# Patient Record
Sex: Female | Born: 1990 | Race: Black or African American | Hispanic: No | Marital: Single | Smoking: Current every day smoker
Health system: Southern US, Community
[De-identification: ages and names within clinical notes are randomized; demographics above are authoritative.]

## PROBLEM LIST (undated history)

## (undated) HISTORY — PX: BREAST SURGERY: SHX581

## (undated) HISTORY — PX: TONSILLECTOMY: SUR1361

---
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
## 2010-02-11 ENCOUNTER — Emergency Department: Payer: Self-pay | Admitting: Emergency Medicine

## 2010-03-15 ENCOUNTER — Emergency Department: Payer: Self-pay | Admitting: Emergency Medicine

## 2010-07-08 ENCOUNTER — Inpatient Hospital Stay: Payer: Self-pay

## 2011-01-09 ENCOUNTER — Emergency Department: Payer: Self-pay | Admitting: Emergency Medicine

## 2011-01-15 ENCOUNTER — Observation Stay: Payer: Self-pay | Admitting: Surgery

## 2011-04-20 ENCOUNTER — Emergency Department: Payer: Self-pay | Admitting: Emergency Medicine

## 2011-04-25 ENCOUNTER — Emergency Department: Payer: Self-pay | Admitting: Unknown Physician Specialty

## 2011-06-18 ENCOUNTER — Emergency Department: Payer: Self-pay | Admitting: *Deleted

## 2011-06-19 ENCOUNTER — Emergency Department: Payer: Self-pay | Admitting: *Deleted

## 2011-08-02 ENCOUNTER — Emergency Department: Payer: Self-pay | Admitting: Emergency Medicine

## 2012-05-12 ENCOUNTER — Emergency Department: Payer: Self-pay | Admitting: Emergency Medicine

## 2012-05-17 ENCOUNTER — Inpatient Hospital Stay: Payer: Self-pay | Admitting: Surgery

## 2012-05-17 LAB — CBC WITH DIFFERENTIAL/PLATELET
Basophil #: 0.1 10*3/uL (ref 0.0–0.1)
Basophil %: 0.9 %
Eosinophil %: 1 %
HGB: 13 g/dL (ref 12.0–16.0)
Lymphocyte %: 26.8 %
MCHC: 32.2 g/dL (ref 32.0–36.0)
Neutrophil #: 5.4 10*3/uL (ref 1.4–6.5)
Neutrophil %: 63.7 %
Platelet: 236 10*3/uL (ref 150–440)
RBC: 4.17 10*6/uL (ref 3.80–5.20)
WBC: 8.5 10*3/uL (ref 3.6–11.0)

## 2012-05-17 LAB — BASIC METABOLIC PANEL
Calcium, Total: 9.1 mg/dL (ref 8.5–10.1)
Chloride: 105 mmol/L (ref 98–107)
Co2: 27 mmol/L (ref 21–32)
Creatinine: 0.8 mg/dL (ref 0.60–1.30)
Potassium: 3.7 mmol/L (ref 3.5–5.1)
Sodium: 139 mmol/L (ref 136–145)

## 2012-05-25 LAB — WOUND CULTURE

## 2012-07-31 ENCOUNTER — Emergency Department: Payer: Self-pay | Admitting: Emergency Medicine

## 2012-09-17 ENCOUNTER — Emergency Department: Payer: Self-pay | Admitting: Emergency Medicine

## 2012-09-19 LAB — BETA STREP CULTURE(ARMC)

## 2012-12-10 ENCOUNTER — Emergency Department: Payer: Self-pay | Admitting: Emergency Medicine

## 2013-03-13 ENCOUNTER — Emergency Department: Payer: Self-pay | Admitting: Emergency Medicine

## 2013-04-23 ENCOUNTER — Emergency Department: Payer: Self-pay | Admitting: Emergency Medicine

## 2014-04-30 ENCOUNTER — Emergency Department: Payer: Self-pay | Admitting: Emergency Medicine

## 2014-12-30 NOTE — H&P (Signed)
PATIENT NAME:  Emma Gonzalez, Emma Gonzalez MR#:  [id number] DATE OF BIRTH:  05-27-1991  DATE OF ADMISSION:  05/17/2012  CHIEF COMPLAINT: Left breast pain.   HISTORY OF PRESENT ILLNESS: This is a patient with a prior history of a left breast abscess incised and drained in May 2012 by Dr. Michela PitcherEly. She was seen by me at the same time.   Patient describes increasing left breast pain starting two weeks ago. She was in the ED a week ago started on antibiotics and this has gradually worsened. She denies fevers or chills. No nausea or vomiting. Her pain is worsening and it is limited to the medial side of the left breast.  She has had an episode like this before necessitating surgery as above.   She denies nipple discharge.   PAST MEDICAL HISTORY: Left breast abscess.   PAST SURGICAL HISTORY:  1. Left breast abscess incision and drainage.   2. Tonsillectomy.  3. Adenoidectomy.   PAST OB HISTORY: G1 P1, AB 0.   FAMILY HISTORY: No history of breast cancer in the family.   SOCIAL HISTORY: Patient smokes tobacco. Does not drink alcohol. Works in a [employer].   REVIEW OF SYSTEMS: 10 system review was performed and negative with the exception of that mentioned in the history of present illness.   PHYSICAL EXAMINATION:  GENERAL: Tearful, anxious-appearing black female patient.   VITALS: Temperature 98.3, pulse 95, respirations 20, blood pressure 117/63, pain scale of 10, 97% room air sats, BMI 29.    HEENT: No scleral icterus.   NECK: No palpable neck nodes.   CHEST: Clear to auscultation.   CARDIAC: Regular rate and rhythm.   ABDOMEN: Soft, nontender.   EXTREMITIES: Without edema. Calves are nontender.   NEUROLOGIC: Grossly intact.   INTEGUMENT: No jaundice. Multiple tattoos are present.   BREASTS: Examination shows left breast with a medially placed mass with erythema and fluctuance beneath a scar at the periareolar area. She also has upper outer quadrant and a lower outer quadrant scar  which is well healed.   LABORATORY, DIAGNOSTIC, AND RADIOLOGICAL DATA: Laboratory values are not available. No imaging has been performed.   ASSESSMENT AND PLAN: This is a patient with left breast abscess. She had this happen to her in a year ago and that required formal incision and drainage with Penrose drains in the Operating Room back in May 2012. This was performed by Dr. Michela PitcherEly and I had seen the patient as well at that time.   I discussed with her the rationale for offering surgery again. She was in full agreement with that plan. Unfortunately, the patient ate lunch at 13:00 to 13:30 hours, tacos, and will need to be Gonzalez.p.o. for several hours prior to scheduling this procedure. I have discussed this with the nursing supervisor for scheduling later on tonight.    Discussed with the patient the rationale for this operation and the risks of bleeding, infection, recurrent abscesses, drain placement, etc. and also the need for doing this later on tonight and that it would be done by Dr. Michela PitcherEly. She understood and agreed to proceed.   ____________________________ Adah Salvageichard E. Excell Seltzerooper, MD rec:cms D: 05/17/2012 17:56:46 ET T: 05/18/2012 06:06:55 ET JOB#: [id number]  cc: Adah Salvageichard E. Excell Seltzerooper, MD, <Dictator>  Lattie HawICHARD E COOPER MD ELECTRONICALLY SIGNED 05/18/2012 8:12

## 2014-12-30 NOTE — Op Note (Signed)
PATIENT NAME:  Emma SimonDEARMON, [name] MR#:  [id number] DATE OF BIRTH:  1991/07/17  DATE OF PROCEDURE:  05/17/2012  PREOPERATIVE DIAGNOSIS: Left breast abscess.   POSTOPERATIVE DIAGNOSIS:  Left breast abscess.  PROCEDURE: Mastotomy with incision and drainage right breast.   SURGEON: Quentin Orealph L. Ely, MD   ANESTHESIA: General.   OPERATIVE PROCEDURE: With the patient in the supine position after induction of appropriate general anesthesia, the patient's left breast was prepped with Betadine and draped with sterile towels. The site of her previous drainage was re-incised and a large amount of creamy purulence removed. The abscess cavity was approximately the size of a golf ball. The purulence was cultured. The area was irrigated. The abscess cavity appeared to track to the left upper outer quadrant. The previous counter incision was reopened and a Penrose drain was placed for counter drainage. The drain was secured with 3-0 nylon. Sterile dressing was applied. The patient was returned to the recovery room having tolerated the procedure well. Sponge, instrument, and needle counts were correct times two in the operating room.   ____________________________ Quentin Orealph L. Ely III, MD rle:bjt D: 05/17/2012 21:33:06 ET T: 05/18/2012 10:05:10 ET JOB#: [id number]  cc: Quentin Orealph L. Ely III, MD, <Dictator> Quentin OreALPH L ELY MD ELECTRONICALLY SIGNED 05/21/2012 8:34

## 2014-12-30 NOTE — H&P (Signed)
    Subjective/Chief Complaint left breast pain    History of Present Illness left br pain 2 weeks, in ED last week. on abx but worsening. no f/c. Had prev episode one year ago with I&D left br abscess. ate lunch 1-1:30.    Past History Left br abscess 2012 G1P1Ab0 no FH of br cancer PMH none PSH T&A, lft br abscess   Past Med/Surgical Hx:  Denies medical history:   Denies medical history:   Tonsillectomy and Adenoidectomy:   ALLERGIES:  No Known Allergies:   Family and Social History:   Family History Non-Contributory    Social History positive  tobacco, negative ETOH, warehouse    + Tobacco Current (within 1 year)    Place of Living Home   Review of Systems:   Subjective/Chief Complaint left breast pain    Fever/Chills No    Cough No    Abdominal Pain No    Diarrhea No    Nausea/Vomiting No    SOB/DOE No    Chest Pain No    Dysuria No    Tolerating Diet Yes  ate at 1300   Physical Exam:   GEN uncomfortable, tearful    HEENT pink conjunctivae    NECK supple    RESP normal resp effort  clear BS  no use of accessory muscles    CARD regular rate    ABD denies tenderness  soft    LYMPH negative neck, negative axillae    EXTR negative edema    SKIN normal to palpation, multiple tattoos    PSYCH alert, A+O to time, place, person, good insight, anxious    Additional Comments left breast scars, medial tender erythematous mass with fluctuance     Assessment/Admission Diagnosis labs pending left br abscess rec I&D risks and options in detail see dictation   Electronic Signatures: Lattie Hawooper, Yamilet Mcfayden E (MD)  (Signed 05-Sep-13 17:52)  Authored: CHIEF COMPLAINT and HISTORY, PAST MEDICAL/SURGIAL HISTORY, ALLERGIES, FAMILY AND SOCIAL HISTORY, REVIEW OF SYSTEMS, PHYSICAL EXAM, ASSESSMENT AND PLAN   Last Updated: 05-Sep-13 17:52 by Lattie Hawooper, Aviyah Swetz E (MD)

## 2015-11-11 ENCOUNTER — Emergency Department
Admission: EM | Admit: 2015-11-11 | Discharge: 2015-11-11 | Disposition: A | Discharge status: Home / Self Care | Payer: Self-pay | Attending: Emergency Medicine | Admitting: Emergency Medicine

## 2015-11-11 ENCOUNTER — Encounter: Payer: Self-pay | Admitting: Emergency Medicine

## 2015-11-11 DIAGNOSIS — A5901 Trichomonal vulvovaginitis: Secondary | ICD-10-CM | POA: Insufficient documentation

## 2015-11-11 DIAGNOSIS — A599 Trichomoniasis, unspecified: Secondary | ICD-10-CM

## 2015-11-11 DIAGNOSIS — F17200 Nicotine dependence, unspecified, uncomplicated: Secondary | ICD-10-CM | POA: Insufficient documentation

## 2015-11-11 DIAGNOSIS — N730 Acute parametritis and pelvic cellulitis: Secondary | ICD-10-CM | POA: Insufficient documentation

## 2015-11-11 DIAGNOSIS — Z3202 Encounter for pregnancy test, result negative: Secondary | ICD-10-CM | POA: Insufficient documentation

## 2015-11-11 DIAGNOSIS — N390 Urinary tract infection, site not specified: Secondary | ICD-10-CM | POA: Insufficient documentation

## 2015-11-11 LAB — BASIC METABOLIC PANEL
Anion gap: 5 (ref 5–15)
BUN: 7 mg/dL (ref 6–20)
CALCIUM: 8.8 mg/dL — AB (ref 8.9–10.3)
CHLORIDE: 108 mmol/L (ref 101–111)
CO2: 26 mmol/L (ref 22–32)
CREATININE: 0.78 mg/dL (ref 0.44–1.00)
GFR calc non Af Amer: >60 mL/min (ref >60)
GLUCOSE: 85 mg/dL (ref 65–99)
Potassium: 3.8 mmol/L (ref 3.5–5.1)
Sodium: 139 mmol/L (ref 135–145)

## 2015-11-11 LAB — URINALYSIS COMPLETE WITH MICROSCOPIC (ARMC ONLY)
BILIRUBIN URINE: NEGATIVE
GLUCOSE, UA: NEGATIVE mg/dL
KETONES UR: NEGATIVE mg/dL
Nitrite: NEGATIVE
Protein, ur: NEGATIVE mg/dL
Specific Gravity, Urine: 1.008 (ref 1.005–1.030)
pH: 7 (ref 5.0–8.0)

## 2015-11-11 LAB — CBC WITH DIFFERENTIAL/PLATELET
BASOS PCT: 1 %
Basophils Absolute: 0 10*3/uL (ref 0–0.1)
Eosinophils Absolute: 0.1 10*3/uL (ref 0–0.7)
Eosinophils Relative: 1 %
HEMATOCRIT: 41.6 % (ref 35.0–47.0)
HEMOGLOBIN: 14 g/dL (ref 12.0–16.0)
LYMPHS ABS: 1.2 10*3/uL (ref 1.0–3.6)
LYMPHS PCT: 20 %
MCH: 32.6 pg (ref 26.0–34.0)
MCHC: 33.5 g/dL (ref 32.0–36.0)
MCV: 97.3 fL (ref 80.0–100.0)
MONO ABS: 0.5 10*3/uL (ref 0.2–0.9)
MONOS PCT: 9 %
NEUTROS ABS: 4.3 10*3/uL (ref 1.4–6.5)
NEUTROS PCT: 69 %
Platelets: 233 10*3/uL (ref 150–440)
RBC: 4.28 MIL/uL (ref 3.80–5.20)
RDW: 14.2 % (ref 11.5–14.5)
WBC: 6.2 10*3/uL (ref 3.6–11.0)

## 2015-11-11 LAB — WET PREP, GENITAL
Sperm: NONE SEEN
Yeast Wet Prep HPF POC: NONE SEEN

## 2015-11-11 LAB — CHLAMYDIA/NGC RT PCR (ARMC ONLY)
CHLAMYDIA TR: DETECTED — AB
N GONORRHOEAE: NOT DETECTED

## 2015-11-11 LAB — POCT PREGNANCY, URINE: PREG TEST UR: NEGATIVE

## 2015-11-11 MED ORDER — CEPHALEXIN 500 MG PO CAPS: 500.0000 mg | ORAL_CAPSULE | Freq: Four times a day (QID) | ORAL | Status: AC

## 2015-11-11 MED ORDER — AZITHROMYCIN 500 MG PO TABS
1000.0000 mg | ORAL_TABLET | Freq: Once | ORAL | Status: AC
  Administered 2015-11-11: 1000 mg via ORAL
  Filled 2015-11-11: qty 2

## 2015-11-11 MED ORDER — ONDANSETRON HCL 4 MG PO TABS: 4.0000 mg | ORAL_TABLET | Freq: Every day | ORAL | Status: DC | PRN

## 2015-11-11 MED ORDER — AZITHROMYCIN 500 MG PO TABS: 1000.0000 mg | ORAL_TABLET | Freq: Once | ORAL | Status: DC

## 2015-11-11 MED ORDER — SODIUM CHLORIDE 0.9 % IV BOLUS (SEPSIS): 30.0000 mL/kg | Freq: Once | INTRAVENOUS | Status: DC

## 2015-11-11 MED ORDER — METRONIDAZOLE 500 MG PO TABS: 500.0000 mg | ORAL_TABLET | Freq: Three times a day (TID) | ORAL | Status: DC

## 2015-11-11 MED ORDER — MORPHINE SULFATE (PF) 4 MG/ML IV SOLN
4.0000 mg | Freq: Once | INTRAVENOUS | Status: AC
  Administered 2015-11-11: 4 mg via INTRAVENOUS
  Filled 2015-11-11: qty 1

## 2015-11-11 MED ORDER — SODIUM CHLORIDE 0.9 % IV BOLUS (SEPSIS)
1000.0000 mL | Freq: Once | INTRAVENOUS | Status: AC
  Administered 2015-11-11: 1000 mL via INTRAVENOUS

## 2015-11-11 MED ORDER — DEXTROSE 5 % IV SOLN
1.0000 g | Freq: Once | INTRAVENOUS | Status: AC
  Administered 2015-11-11: 1 g via INTRAVENOUS
  Filled 2015-11-11: qty 10

## 2015-11-11 MED ORDER — MORPHINE SULFATE (PF) 4 MG/ML IV SOLN: 4.0000 mg | Freq: Once | INTRAVENOUS | Status: DC

## 2015-11-11 MED ORDER — SODIUM CHLORIDE 0.9 % IV BOLUS (SEPSIS): 1000.0000 mL | Freq: Once | INTRAVENOUS | Status: DC

## 2015-11-11 MED ORDER — OXYCODONE-ACETAMINOPHEN 5-325 MG PO TABS: 1.0000 | ORAL_TABLET | Freq: Four times a day (QID) | ORAL | Status: DC | PRN

## 2015-11-11 MED ORDER — METRONIDAZOLE IN NACL 5-0.79 MG/ML-% IV SOLN
500.0000 mg | Freq: Once | INTRAVENOUS | Status: AC
Start: 2015-11-11 — End: 2015-11-11
  Administered 2015-11-11: 500 mg via INTRAVENOUS
  Filled 2015-11-11: qty 100

## 2015-11-11 MED ORDER — CEPHALEXIN 500 MG PO CAPS: 500.0000 mg | ORAL_CAPSULE | Freq: Four times a day (QID) | ORAL | Status: DC

## 2015-11-11 MED ORDER — DOXYCYCLINE HYCLATE 100 MG PO CAPS: 100.0000 mg | ORAL_CAPSULE | Freq: Two times a day (BID) | ORAL | Status: DC

## 2015-11-11 NOTE — ED Notes (Signed)
Pt in with complaints of a "lump on vaginal wall."  Pt reports discomfort with intercourse beginning approximately 1 week ago.  Pt reports having a normal menstrual cycle ending yesterday; denies any bleeding since then.  Pt A/Ox4, pt tearful, no immediate distress at this time.

## 2015-11-11 NOTE — Discharge Instructions (Signed)

## 2015-11-11 NOTE — ED Provider Notes (Addendum)
[facility] Emergency Department Provider Note  ____________________________________________   I have reviewed the triage vital signs and the nursing notes.   HISTORY  Chief Complaint Abscess    HPI Emma Gonzalez is a 25 y.o. female who is not pregnant, states she is is sexually active. She states that her partner, with him she no longer is told he had chlamydia and that she should be tested. This is about a week ago. Patient then had a gradual onset vaginal discharge and she thought she felt a bump in her vagina. She has had no fever no chills no nausea no vomiting. She has had a purulent vaginal discharge. Her last missed her period ended yesterday. She denies bleeding since that time. She felt that the bump was on the left side of her vagina. She states it may have burst already. She has never had a Bartholin's cyst.   History reviewed. No pertinent past medical history.  There are no active problems to display for this patient.   History reviewed. No pertinent past surgical history.  No current outpatient prescriptions on file.  Allergies Review of patient's allergies indicates no known allergies.  History reviewed. No pertinent family history.  Social History Social History  Substance Use Topics  . Smoking status: Current Every Day Smoker  . Smokeless tobacco: None  . Alcohol Use: None    Review of Systems Constitutional: No fever/chills Eyes: No visual changes. ENT: No sore throat. No stiff neck no neck pain Cardiovascular: Denies chest pain. Respiratory: Denies shortness of breath. Gastrointestinal:   no vomiting.  No diarrhea.  No constipation. Genitourinary: Negative for dysuria. Musculoskeletal: Negative lower extremity swelling Skin: Negative for rash. Neurological: Negative for headaches, focal weakness or numbness. 10-point ROS otherwise negative.  ____________________________________________   PHYSICAL  EXAM:  VITAL SIGNS: ED Triage Vitals  Enc Vitals Group     BP 11/11/15 0906 115/74 mmHg     Pulse Rate 11/11/15 0906 90     Resp 11/11/15 0906 18     Temp 11/11/15 0906 98.6 F (37 C)     Temp Source 11/11/15 0906 Oral     SpO2 11/11/15 0906 99 %     Weight 11/11/15 0906 185 lb (83.915 kg)     Height 11/11/15 0906 5\' 6"  (1.676 m)     Head Cir --      Peak Flow --      Pain Score 11/11/15 0858 10     Pain Loc --      Pain Edu? --      Excl. in GC? --     Constitutional: Alert and oriented. Well appearing and in no acute distress. Eyes: Conjunctivae are normal. PERRL. EOMI. Head: Atraumatic. Nose: No congestion/rhinnorhea. Mouth/Throat: Mucous membranes are moist.  Oropharynx non-erythematous. Neck: No stridor.   Nontender with no meningismus Cardiovascular: Normal rate, regular rhythm. Grossly normal heart sounds.  Good peripheral circulation. Respiratory: Normal respiratory effort.  No retractions. Lungs CTAB. Abdominal: Soft and nontender. No distention. No guarding no rebound Back:  There is no focal tenderness or step off there is no midline tenderness there are no lesions noted. there is no CVA tenderness Pelvic exam: Female nurse chaperone present, no external lesions noted, I do not palpate any mass noted I see any cyst or mass. Purulent yellow vaginal discharge noted with no purulent discharge, positive cervical motion tenderness, no adnexal tenderness or mass, there is no significant uterine tenderness or mass. No vaginal bleeding Musculoskeletal:  No lower extremity tenderness. No joint effusions, no DVT signs strong distal pulses no edema Neurologic:  Normal speech and language. No gross focal neurologic deficits are appreciated.  Skin:  Skin is warm, dry and intact. No rash noted. Psychiatric: Mood and affect are normal. Speech and behavior are normal.  ____________________________________________   LABS (all labs ordered are listed, but only abnormal results are  displayed)  Labs Reviewed  WET PREP, GENITAL - Abnormal; Notable for the following:    Trich, Wet Prep PRESENT (*)    Clue Cells Wet Prep HPF POC PRESENT (*)    WBC, Wet Prep HPF POC MANY (*)    All other components within normal limits  URINALYSIS COMPLETEWITH MICROSCOPIC ([facility] ONLY) - Abnormal; Notable for the following:    Color, Urine YELLOW (*)    APPearance HAZY (*)    Hgb urine dipstick 3+ (*)    Leukocytes, UA 3+ (*)    Bacteria, UA RARE (*)    Squamous Epithelial / LPF 6-30 (*)    All other components within normal limits  CHLAMYDIA/NGC RT PCR ([facility] ONLY)  CBC WITH DIFFERENTIAL/PLATELET  RPR  HIV ANTIBODY (ROUTINE TESTING)  BASIC METABOLIC PANEL  POC URINE PREG, ED  POCT PREGNANCY, URINE   ____________________________________________  EKG  I personally interpreted any EKGs ordered by me or triage  ____________________________________________  RADIOLOGY  I reviewed any imaging ordered by me or triage that were performed during my shift ____________________________________________   PROCEDURES  Procedure(s) performed: None  Critical Care performed: None  ____________________________________________   INITIAL IMPRESSION / ASSESSMENT AND PLAN / ED COURSE  Pertinent labs & imaging results that were available during my care of the patient were reviewed by me and considered in my medical decision making (see chart for details).  Patient with cervical motion tenderness and purulent vaginal discharge with known Chlamydia exposure and positive Trichomonas. I'm giving her Rocephin here as well as azithromycin, we are giving her Flagyl. We will treat her at home with Flagyl and doxycycline. Evidence of PID is certainly present there is nothing at this time to suggest the patient has a tubo-ovarian abscess, or any other acute abdominal pathology. Her adnexal exam is normal. No other sign or see any evidence of a cyst, Bartholin's are otherwise noted or see  an abscess at this time. Certainly possible that she had one that ruptured but this time is no evidence of it. Extensive return precautions and follow-up given and understood. Heart rate was up at one point in the 120s however that was during the pelvic exam. Since that time she has been comfortable and calm. I do not believe she is septic. White count is reassuring.Patient is very comfortable with the discharge plan. Customary extensive return precautions and follow-up explained to and understood by the patient.  Questions answered.  ----------------------------------------- 2:23 PM on 11/11/2015 -----------------------------------------  Dr. Tiburcio Pea agrees with plan and management and will follow up for outpatient evaluation. The patient this time has no complaints. Her urine certainly looks like it could be infected but I suspect this is mostly from her significant vaginal discharge. She prefers not to have a urine catheter at this time. We'll send a urine culture and I will appear to treat her but she has no symptoms or UTI at the bases from a poor clean catch. I have a splint to the patient multiple times that a urine catheter would give me a better information about possible UTI that this time she declines. She states  she is tender down there does not wish a catheterization. Of course this is her choice. I will therefore add Keflex under the management of her possible UTI.  ____________________________________________   FINAL CLINICAL IMPRESSION(S) / ED DIAGNOSES  Final diagnoses:  None      This chart was dictated using voice recognition software.  Despite best efforts to proofread,  errors can occur which can change meaning.     Jeanmarie Plant, MD 11/11/15 1414  Jeanmarie Plant, MD 11/11/15 1424  Jeanmarie Plant, MD 11/11/15 1427  Jeanmarie Plant, MD 11/11/15 [phone]

## 2015-11-11 NOTE — ED Notes (Signed)
Pt called out, reports "I think it just bust."  Clear, white discharge noted from vagina at this time.  No bleeding noted.

## 2015-11-11 NOTE — ED Notes (Signed)
Reports abscess in vaginal area

## 2015-11-12 LAB — RPR: RPR Ser Ql: NONREACTIVE

## 2015-11-12 LAB — HIV ANTIBODY (ROUTINE TESTING W REFLEX): HIV Screen 4th Generation wRfx: NONREACTIVE

## 2015-11-13 ENCOUNTER — Telehealth: Payer: Self-pay | Admitting: Emergency Medicine

## 2015-11-13 LAB — URINE CULTURE

## 2015-11-13 NOTE — ED Notes (Signed)
Called pt to inform of positive chlamydia test.  She was treated in the ED.  Left message asking her to call me.

## 2015-12-30 ENCOUNTER — Encounter: Payer: Self-pay | Admitting: Emergency Medicine

## 2015-12-30 ENCOUNTER — Emergency Department
Admission: EM | Admit: 2015-12-30 | Discharge: 2015-12-30 | Disposition: A | Discharge status: Home / Self Care | Attending: Student | Admitting: Student

## 2015-12-30 ENCOUNTER — Emergency Department

## 2015-12-30 DIAGNOSIS — Y9389 Activity, other specified: Secondary | ICD-10-CM | POA: Insufficient documentation

## 2015-12-30 DIAGNOSIS — W228XXA Striking against or struck by other objects, initial encounter: Secondary | ICD-10-CM | POA: Insufficient documentation

## 2015-12-30 DIAGNOSIS — Y929 Unspecified place or not applicable: Secondary | ICD-10-CM | POA: Insufficient documentation

## 2015-12-30 DIAGNOSIS — Y999 Unspecified external cause status: Secondary | ICD-10-CM | POA: Insufficient documentation

## 2015-12-30 DIAGNOSIS — F17210 Nicotine dependence, cigarettes, uncomplicated: Secondary | ICD-10-CM | POA: Insufficient documentation

## 2015-12-30 DIAGNOSIS — S0093XA Contusion of unspecified part of head, initial encounter: Secondary | ICD-10-CM

## 2015-12-30 MED ORDER — ACETAMINOPHEN 500 MG PO TABS
1000.0000 mg | ORAL_TABLET | Freq: Once | ORAL | Status: AC
  Administered 2015-12-30: 1000 mg via ORAL
  Filled 2015-12-30: qty 2

## 2015-12-30 MED ORDER — ONDANSETRON 4 MG PO TBDP
4.0000 mg | ORAL_TABLET | Freq: Once | ORAL | Status: AC
  Administered 2015-12-30: 4 mg via ORAL

## 2015-12-30 MED ORDER — IBUPROFEN 800 MG PO TABS
800.0000 mg | ORAL_TABLET | Freq: Once | ORAL | Status: AC
  Administered 2015-12-30: 800 mg via ORAL
  Filled 2015-12-30: qty 1

## 2015-12-30 MED ORDER — ONDANSETRON 8 MG PO TBDP
ORAL_TABLET | ORAL | Status: AC
  Filled 2015-12-30: qty 1
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
## 2015-12-30 NOTE — Discharge Instructions (Signed)
Facial or Scalp Contusion °A facial or scalp contusion is a deep bruise on the face or head. Injuries to the face and head generally cause a lot of swelling, especially around the eyes. Contusions are the result of an injury that caused bleeding under the skin. The contusion may turn blue, purple, or yellow. Minor injuries will give you a painless contusion, but more severe contusions may stay painful and swollen for a few weeks.  °CAUSES  °A facial or scalp contusion is caused by a blunt injury or trauma to the face or head area.  °SIGNS AND SYMPTOMS  °· Swelling of the injured area.   °· Discoloration of the injured area.   °· Tenderness, soreness, or pain in the injured area.   °DIAGNOSIS  °The diagnosis can be made by taking a medical history and doing a physical exam. An X-ray exam, CT scan, or MRI may be needed to determine if there are any associated injuries, such as broken bones (fractures). °TREATMENT  °Often, the best treatment for a facial or scalp contusion is applying cold compresses to the injured area. Over-the-counter medicines may also be recommended for pain control.  °HOME CARE INSTRUCTIONS  °· Only take over-the-counter or prescription medicines as directed by your health care provider.   °· Apply ice to the injured area.   °· Put ice in a plastic bag.   °· Place a towel between your skin and the bag.   °· Leave the ice on for 20 minutes, 2-3 times a day.   °SEEK MEDICAL CARE IF: °· You have bite problems.   °· You have pain with chewing.   °· You are concerned about facial defects. °SEEK IMMEDIATE MEDICAL CARE IF: °· You have severe pain or a headache that is not relieved by medicine.   °· You have unusual sleepiness, confusion, or personality changes.   °· You throw up (vomit).   °· You have a persistent nosebleed.   °· You have double vision or blurred vision.   °· You have fluid drainage from your nose or ear.   °· You have difficulty walking or using your arms or legs.   °MAKE SURE YOU:   °· Understand these instructions. °· Will watch your condition. °· Will get help right away if you are not doing well or get worse. °  °This information is not intended to replace advice given to you by your health care provider. Make sure you discuss any questions you have with your health care provider. °  °Document Released: 10/06/2004 Document Revised: 09/19/2014 Document Reviewed: 04/11/2013 °Elsevier Interactive Patient Education ©2016 [employer] ° °Head Injury, Adult °You have a head injury. Headaches and throwing up (vomiting) are common after a head injury. It should be easy to wake up from sleeping. Sometimes you must stay in the hospital. Most problems happen within the first 24 hours. Side effects may occur up to 7-10 days after the injury.  °WHAT ARE THE TYPES OF HEAD INJURIES? °Head injuries can be as minor as a bump. Some head injuries can be more severe. More severe head injuries include: °· A jarring injury to the brain (concussion). °· A bruise of the brain (contusion). This mean there is bleeding in the brain that can cause swelling. °· A cracked skull (skull fracture). °· Bleeding in the brain that collects, clots, and forms a bump (hematoma). °WHEN SHOULD I GET HELP RIGHT AWAY?  °· You are confused or sleepy. °· You cannot be woken up. °· You feel sick to your stomach (nauseous) or keep throwing up (vomiting). °· Your dizziness or   unsteadiness is getting worse. °· You have very bad, lasting headaches that are not helped by medicine. Take medicines only as told by your doctor. °· You cannot use your arms or legs like normal. °· You cannot walk. °· You notice changes in the black spots in the center of the colored part of your eye (pupil). °· You have clear or bloody fluid coming from your nose or ears. °· You have trouble seeing. °During the next 24 hours after the injury, you must stay with someone who can watch you. This person should get help right away (call 911 in the [place] if you start  to shake and are not able to control it (have seizures), you pass out, or you are unable to wake up. °HOW CAN I PREVENT A HEAD INJURY IN THE FUTURE? °· Wear seat belts. °· Wear a helmet while bike riding and playing sports like football. °· Stay away from dangerous activities around the house. °WHEN CAN I RETURN TO NORMAL ACTIVITIES AND ATHLETICS? °See your doctor before doing these activities. You should not do normal activities or play contact sports until 1 week after the following symptoms have stopped: °· Headache that does not go away. °· Dizziness. °· Poor attention. °· Confusion. °· Memory problems. °· Sickness to your stomach or throwing up. °· Tiredness. °· Fussiness. °· Bothered by bright lights or loud noises. °· Anxiousness or depression. °· Restless sleep. °MAKE SURE YOU:  °· Understand these instructions. °· Will watch your condition. °· Will get help right away if you are not doing well or get worse. °  °This information is not intended to replace advice given to you by your health care provider. Make sure you discuss any questions you have with your health care provider. °  °Document Released: 08/11/2008 Document Revised: 09/19/2014 Document Reviewed: 05/06/2013 °Elsevier Interactive Patient Education ©2016 Elsevier Inc. ° °

## 2015-12-30 NOTE — ED Notes (Signed)
Patient is tearful and states she has a headache. States she does not really know why she is here. Per officer patient was arrested this am and bit officer. He presents Therapist, occupationalmagistrate order for blood draw due to officer exposure.

## 2015-12-30 NOTE — ED Notes (Signed)
Patient c/o HA. Patient states, "My head hurts because its been slapped into the concrete". Patient has small brusied area to forehead.

## 2017-09-11 IMAGING — CT CT HEAD W/O CM
1 series · 16 of 30 positions shown, 20 images · non-contrast
Comparison: 04/24/2013

CLINICAL DATA: Headaches following possible head trauma

EXAM:
CT HEAD WITHOUT CONTRAST
TECHNIQUE: Contiguous axial images were obtained from the base of the skull
through the vertex without intravenous contrast.

[Series 2: head wo · axial · 0.39mm/px · z∈[-157,-31]mm · 16 of 32 slices shown, 20 images]
[im 2/32  brain]
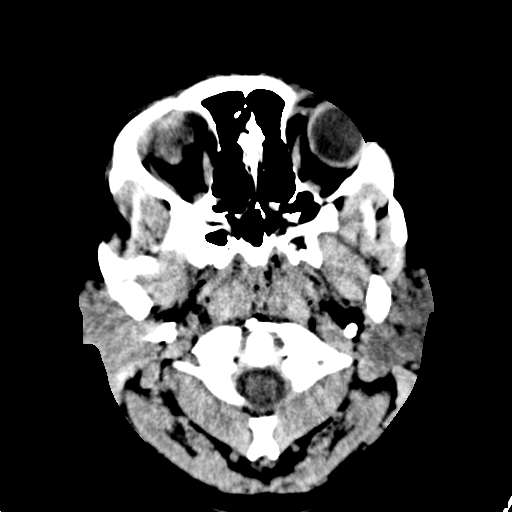
[im 2/32  bone]
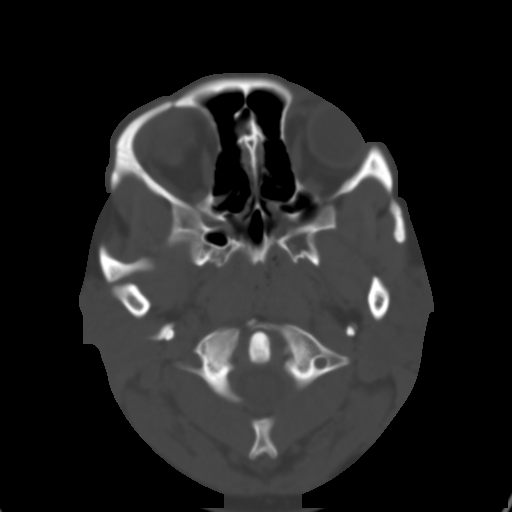
[im 4/32  brain]
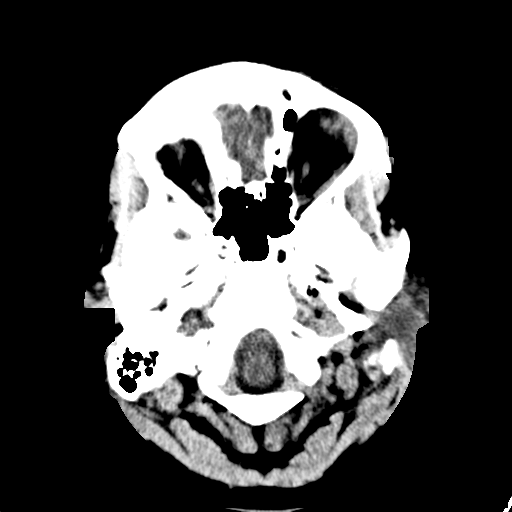
[im 6/32  brain]
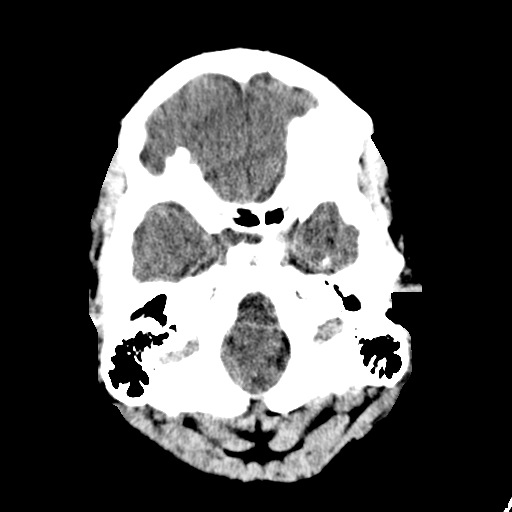
[im 8/32  brain]
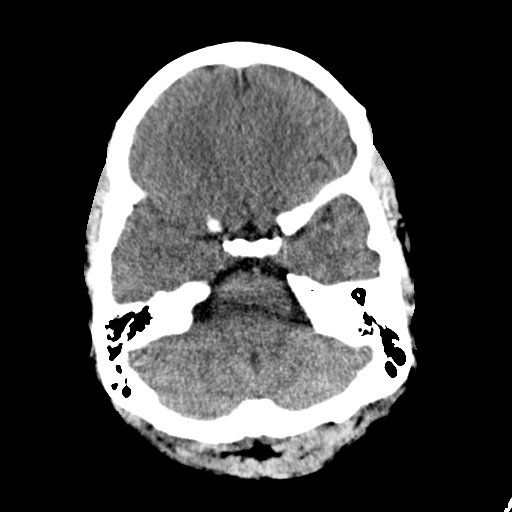
[im 9/32  brain]
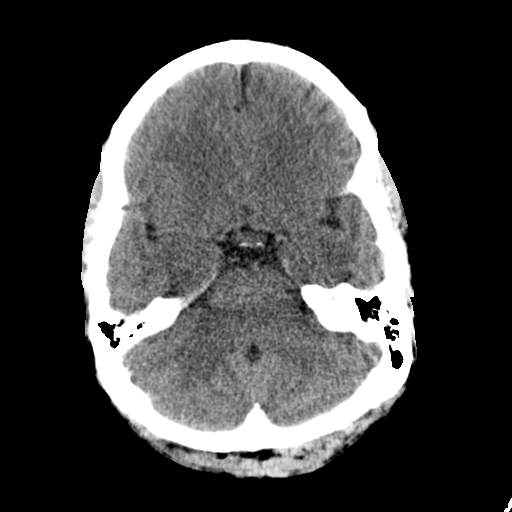
[im 9/32  bone]
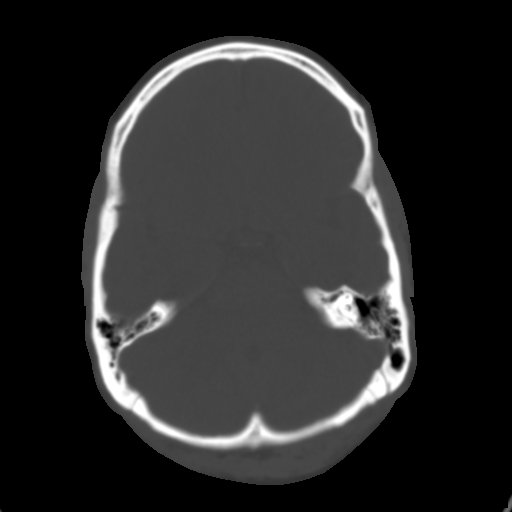
[im 11/32  brain]
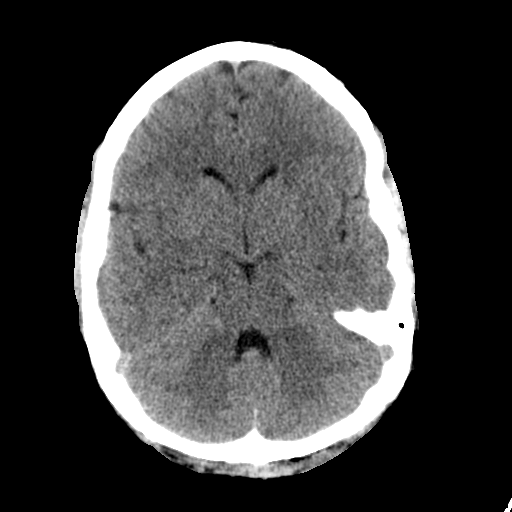
[im 13/32  brain]
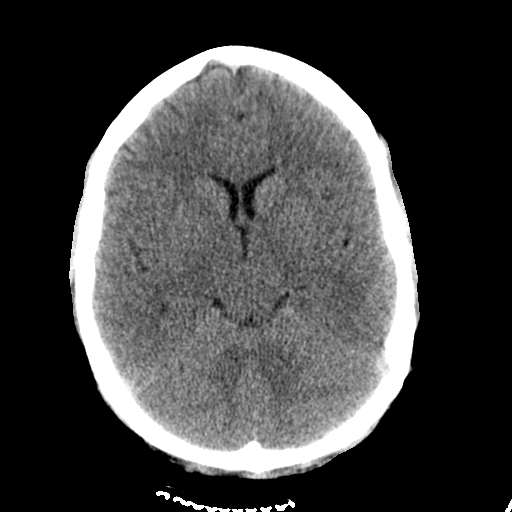
[im 15/32  brain]
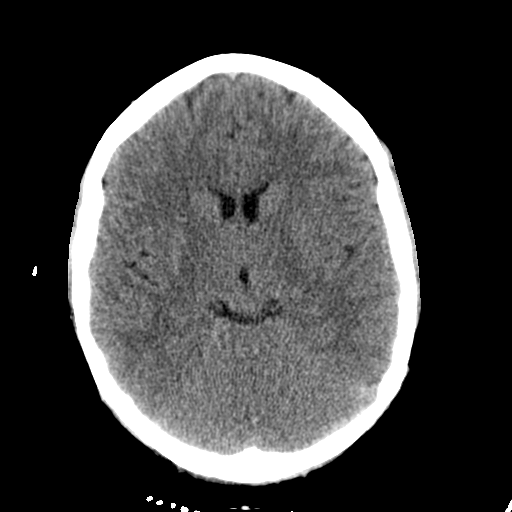
[im 17/32  brain]
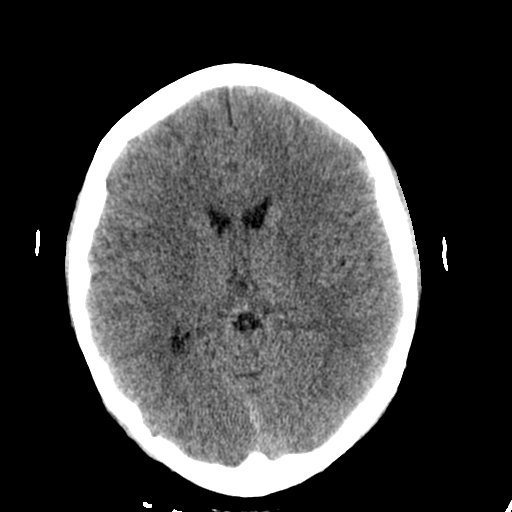
[im 17/32  bone]
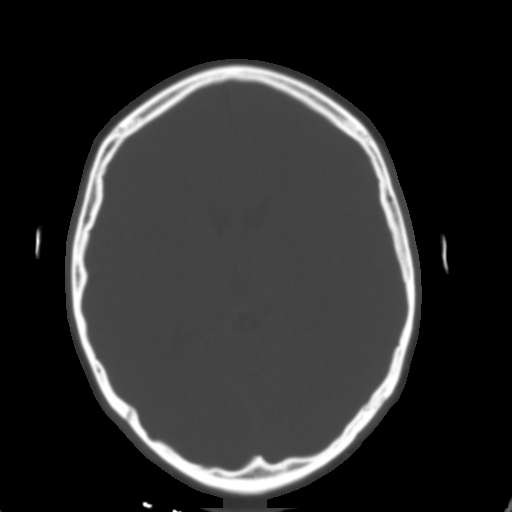
[im 19/32  brain]
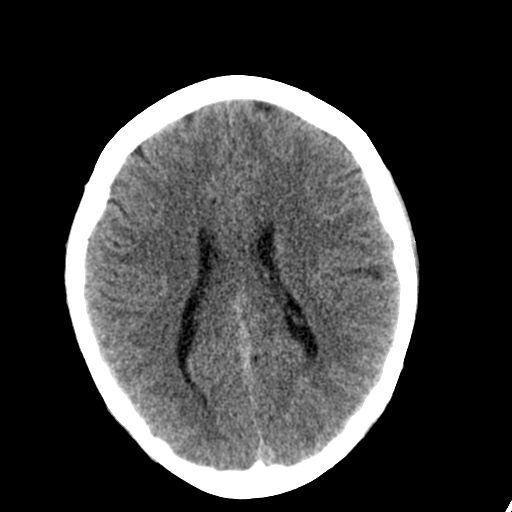
[im 21/32  brain]
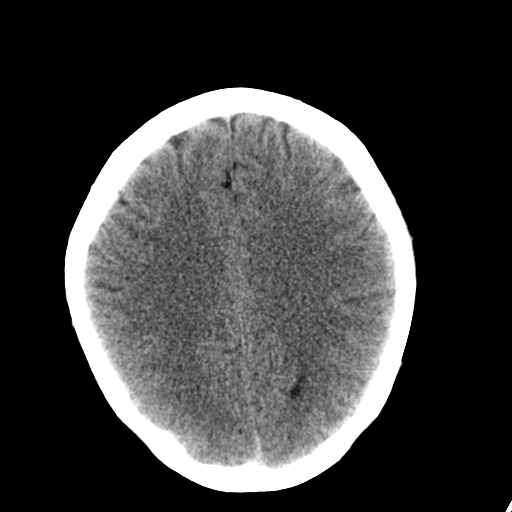
[im 23/32  brain]
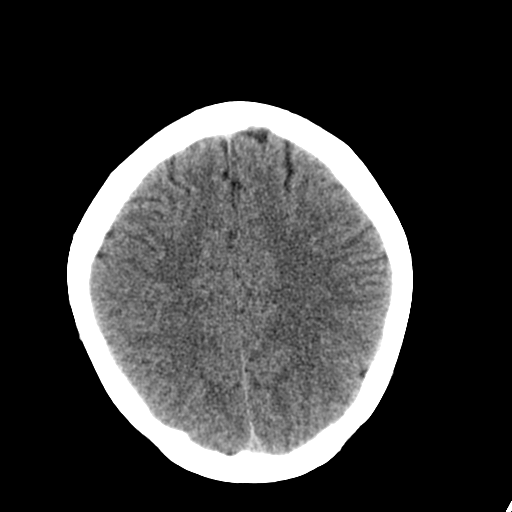
[im 24/32  brain]
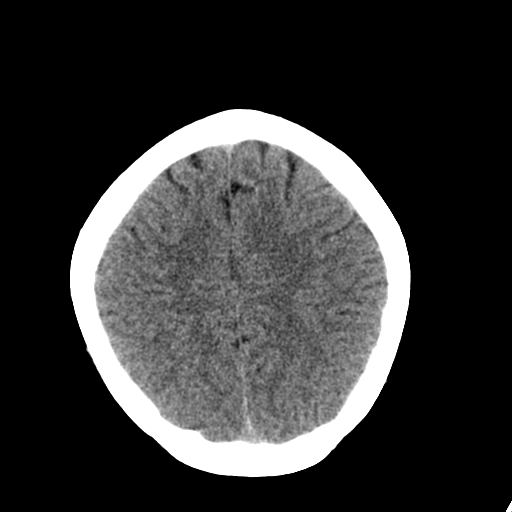
[im 24/32  bone]
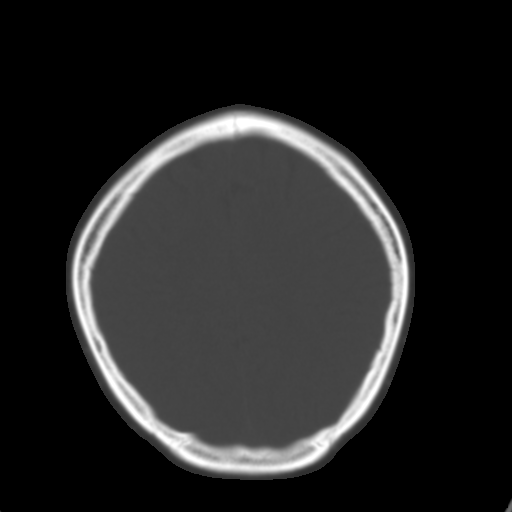
[im 26/32  brain]
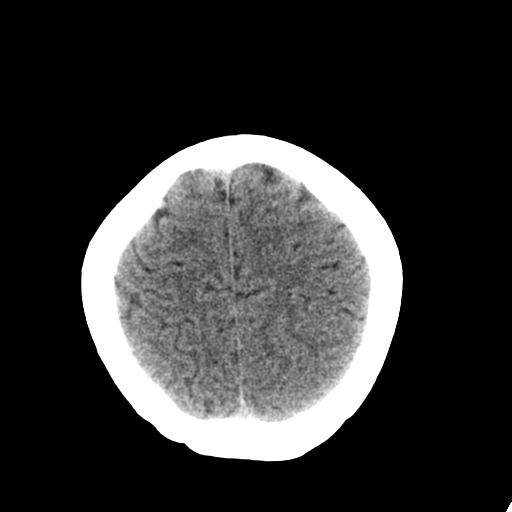
[im 28/32  brain]
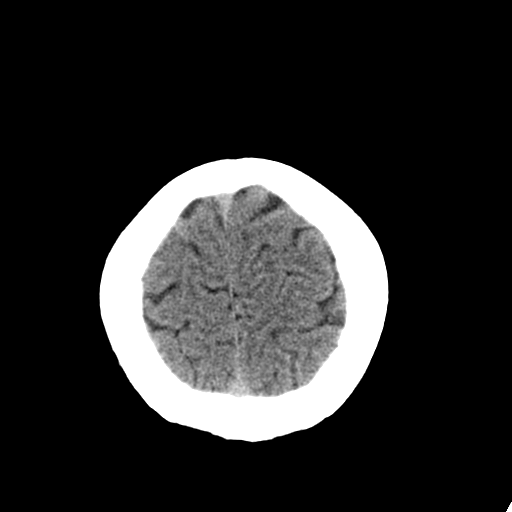
[im 30/32  brain]
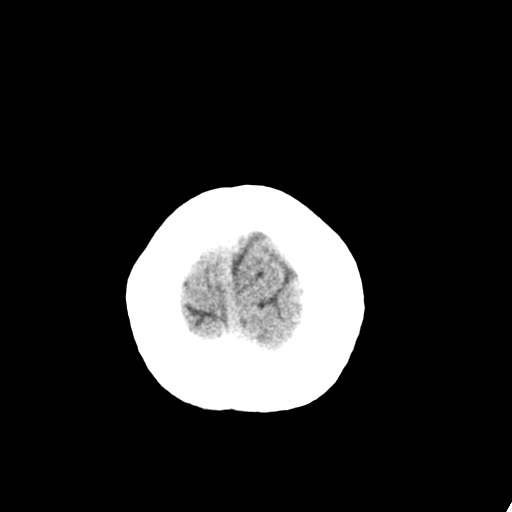

[16 of 30 positions shown; findings below may reference images not displayed]

FINDINGS: The bony calvarium is intact. The ventricles are of normal size and
configuration. No findings to suggest acute hemorrhage, acute
infarction or space-occupying mass lesion are noted.
IMPRESSION: No acute intracranial abnormality noted.

## 2019-12-30 ENCOUNTER — Ambulatory Visit: Payer: Self-pay | Admitting: Family Medicine

## 2019-12-30 ENCOUNTER — Encounter: Payer: Self-pay | Admitting: Family Medicine

## 2019-12-30 ENCOUNTER — Other Ambulatory Visit: Payer: Self-pay

## 2019-12-30 DIAGNOSIS — Z113 Encounter for screening for infections with a predominantly sexual mode of transmission: Secondary | ICD-10-CM

## 2019-12-30 DIAGNOSIS — Z202 Contact with and (suspected) exposure to infections with a predominantly sexual mode of transmission: Secondary | ICD-10-CM

## 2019-12-30 DIAGNOSIS — L732 Hidradenitis suppurativa: Secondary | ICD-10-CM

## 2019-12-30 LAB — WET PREP FOR TRICH, YEAST, CLUE
Trichomonas Exam: POSITIVE — AB
Yeast Exam: NEGATIVE

## 2019-12-30 MED ORDER — DOXYCYCLINE HYCLATE 100 MG PO TABS
100.0000 mg | ORAL_TABLET | Freq: Two times a day (BID) | ORAL | 0 refills | Status: AC
Start: 2019-12-30 — End: 2020-01-06

## 2019-12-30 MED ORDER — METRONIDAZOLE 500 MG PO TABS: 2000.0000 mg | ORAL_TABLET | Freq: Once | ORAL | 0 refills | Status: AC

## 2019-12-30 MED ORDER — CEFTRIAXONE SODIUM 500 MG IJ SOLR
500.0000 mg | Freq: Once | INTRAMUSCULAR | Status: AC
Start: 2019-12-30 — End: 2019-12-30
  Administered 2019-12-30: 500 mg via INTRAMUSCULAR

## 2019-12-30 NOTE — Progress Notes (Signed)
[employer] results reviewed. Patient treated for +Trich and as a contact to GC/Chlamydia. PCP list and condoms given. Tawny Hopping, RN

## 2019-12-30 NOTE — Progress Notes (Signed)
[facility] Department STI clinic/screening visit  Subjective:  Emma Gonzalez is a 29 y.o. female being seen today for No chief complaint on file.    The patient reports they do have symptoms. Patient reports that they do not desire a pregnancy in the next year. They reported they are not interested in discussing contraception today.   Patient has the following medical conditions:  There are no problems to display for this patient.   HPI  Pt reports she is here for STI screening and tx. She has had some urinary discomfort, feels incomplete emptying after urination x2 days. Was recently told partner tested positive for chlamydia, gonorrhea and trich.   See flowsheet for further details and programmatic requirements.    Patient's last menstrual period was 12/25/2019 (exact date). Last sex: 4/10 BCM: none Desires EC? n/a   No components found for: HCV  The following portions of the patient's history were reviewed and updated as appropriate: allergies, current medications, past medical history, past social history, past surgical history and problem list.  Objective:  There were no vitals filed for this visit.   Physical Exam Vitals and nursing note reviewed.  Constitutional:      Appearance: Normal appearance.  HENT:     Head: Normocephalic and atraumatic.     Mouth/Throat:     Mouth: Mucous membranes are moist.     Pharynx: Oropharynx is clear. No oropharyngeal exudate or posterior oropharyngeal erythema.  Pulmonary:     Effort: Pulmonary effort is normal.  Abdominal:     General: Abdomen is flat.     Palpations: There is no mass.     Tenderness: There is no abdominal tenderness. There is no rebound.  Genitourinary:    General: Normal vulva.     Exam position: Lithotomy position.     Pubic Area: No rash or pubic lice.      Labia:        Right: No rash or lesion.        Left: No rash or lesion.      Vagina: Bleeding (small amount menses in vault )  present. No vaginal discharge, erythema or lesions.     Cervix: No cervical motion tenderness, discharge, friability, lesion or erythema.     Uterus: Normal.      Adnexa: Right adnexa normal and left adnexa normal.     Rectum: Normal.    Lymphadenopathy:     Head:     Right side of head: No preauricular or posterior auricular adenopathy.     Left side of head: No preauricular or posterior auricular adenopathy.     Cervical: No cervical adenopathy.     Upper Body:     Right upper body: No supraclavicular or axillary adenopathy.     Left upper body: No supraclavicular or axillary adenopathy.     Lower Body: No right inguinal adenopathy. No left inguinal adenopathy.  Skin:    General: Skin is warm and dry.     Findings: No rash.  Neurological:     Mental Status: She is alert and oriented to person, place, and time.      Assessment and Plan:  Emma Gonzalez is a 29 y.o. female presenting to the [facility] Department for STI screening    1. Screening examination for venereal disease -Pt with symptoms. Screenings today as below. Treat wet prep per standing order.  -Patient does not meet criteria for HepB, HepC Screening. Declines HIV and syphilis screenings. -  Counseled on warning s/sx and when to seek care. Recommended condom use with all sex and discussed importance of condom use for STI prevention. -Advised to see PCP if urinary symptoms not resolved w/STI treatment. - WET PREP FOR [facility], [facility], [facility]  2. Gonorrhea contact -Treatment today as below. Pt weight <300 lbs. -Pt counseled regarding medication. No known allergies to these medications.  -Advised no sex for 7 days after both pt and partner completes treatment and encouraged condoms with all sex. - cefTRIAXone (ROCEPHIN) injection 500 mg - doxycycline (VIBRA-TABS) 100 MG tablet; Take 1 tablet (100 mg total) by mouth 2 (two) times daily for 7 days.  Dispense: 14 tablet;  Refill: 0  3. Chlamydia contact -Tx CT and GC today - cefTRIAXone (ROCEPHIN) injection 500 mg - doxycycline (VIBRA-TABS) 100 MG tablet; Take 1 tablet (100 mg total) by mouth 2 (two) times daily for 7 days.  Dispense: 14 tablet; Refill: 0  4. Trichomonas contact -Will treat Trich with Metronidazole 2g po once today. Advised to take with food, avoid alcohol for 24 hrs before - 72 hrs after taking medicine. RTC if vomits < 2 hr after taking medicine. -Advised no sex for 7 days until both pt and partner completes treatment and encouraged condoms with all sex. No known allergy to this medication.  - metroNIDAZOLE (FLAGYL) 500 MG tablet; Take 4 tablets (2,000 mg total) by mouth once for 1 dose.  Dispense: 4 tablet; Refill: 0  5. Hidradenitis suppurativa -PCP list given, advised to follow up for further evaluation/treatment discussion if desired.    Return if symptoms worsen or fail to improve.  No future appointments.  Kandee Keen, PA-C

## 2019-12-30 NOTE — Progress Notes (Signed)
In for screening due to finding out recent partner had GC,Trich,Chlamydia; declines HIV/RPR testing Sharlette Dense, RN

## 2020-01-03 ENCOUNTER — Telehealth: Payer: Self-pay

## 2020-01-03 DIAGNOSIS — A749 Chlamydial infection, unspecified: Secondary | ICD-10-CM

## 2020-01-03 NOTE — Telephone Encounter (Signed)
TC to patient. Verified ID via password/SS#. Informed of positive chlamydia.  Patient tx'd at her visit with Doxy and is tolerating the meds well. IInstructed to have partner call for tx appt if still needed. Richmond Campbell, RN

## 2020-04-07 ENCOUNTER — Other Ambulatory Visit: Payer: Self-pay

## 2022-04-13 ENCOUNTER — Other Ambulatory Visit: Payer: Self-pay

## 2022-04-13 ENCOUNTER — Emergency Department
Admission: EM | Admit: 2022-04-13 | Discharge: 2022-04-13 | Disposition: A | Discharge status: Home / Self Care | Attending: Emergency Medicine | Admitting: Emergency Medicine

## 2022-04-13 ENCOUNTER — Encounter: Payer: Self-pay | Admitting: Emergency Medicine

## 2022-04-13 DIAGNOSIS — N611 Abscess of the breast and nipple: Secondary | ICD-10-CM | POA: Insufficient documentation

## 2022-04-13 LAB — CBC WITH DIFFERENTIAL/PLATELET
Abs Immature Granulocytes: 0.01 10*3/uL (ref 0.00–0.07)
Basophils Absolute: 0 10*3/uL (ref 0.0–0.1)
Basophils Relative: 1 %
Eosinophils Absolute: 0.1 10*3/uL (ref 0.0–0.5)
Eosinophils Relative: 2 %
HCT: 43.6 % (ref 36.0–46.0)
Hemoglobin: 14.7 g/dL (ref 12.0–15.0)
Immature Granulocytes: 0 %
Lymphocytes Relative: 23 %
Lymphs Abs: 1.5 10*3/uL (ref 0.7–4.0)
MCH: 33 pg (ref 26.0–34.0)
MCHC: 33.7 g/dL (ref 30.0–36.0)
MCV: 98 fL (ref 80.0–100.0)
Monocytes Absolute: 0.6 10*3/uL (ref 0.1–1.0)
Monocytes Relative: 9 %
Neutro Abs: 4.2 10*3/uL (ref 1.7–7.7)
Neutrophils Relative %: 65 %
Platelets: 244 10*3/uL (ref 150–400)
RBC: 4.45 MIL/uL (ref 3.87–5.11)
RDW: 13.4 % (ref 11.5–15.5)
WBC: 6.4 10*3/uL (ref 4.0–10.5)
nRBC: 0 % (ref 0.0–0.2)

## 2022-04-13 LAB — BASIC METABOLIC PANEL
Anion gap: 7 (ref 5–15)
BUN: 10 mg/dL (ref 6–20)
CO2: 25 mmol/L (ref 22–32)
Calcium: 9.3 mg/dL (ref 8.9–10.3)
Chloride: 107 mmol/L (ref 98–111)
Creatinine, Ser: 0.84 mg/dL (ref 0.44–1.00)
GFR, Estimated: >60 mL/min (ref >60)
Glucose, Bld: 87 mg/dL (ref 70–99)
Potassium: 4.3 mmol/L (ref 3.5–5.1)
Sodium: 139 mmol/L (ref 135–145)

## 2022-04-13 MED ORDER — LIDOCAINE HCL (PF) 1 % IJ SOLN
5.0000 mL | Freq: Once | INTRAMUSCULAR | Status: AC
  Administered 2022-04-13: 5 mL via INTRADERMAL
  Filled 2022-04-13: qty 5

## 2022-04-13 MED ORDER — LIDOCAINE-PRILOCAINE 2.5-2.5 % EX CREA
TOPICAL_CREAM | CUTANEOUS | Status: AC
  Filled 2022-04-13: qty 5

## 2022-04-13 MED ORDER — CEPHALEXIN 500 MG PO CAPS: 500.0000 mg | ORAL_CAPSULE | Freq: Three times a day (TID) | ORAL | 0 refills | Status: AC

## 2022-04-13 MED ORDER — SULFAMETHOXAZOLE-TRIMETHOPRIM 800-160 MG PO TABS: 1.0000 | ORAL_TABLET | Freq: Two times a day (BID) | ORAL | 0 refills | Status: AC

## 2022-04-13 NOTE — ED Notes (Signed)
See triage note  Presents with red swollen area to left breast  Area noted near nipple  Hx of same

## 2022-04-13 NOTE — ED Triage Notes (Signed)
Pt to ED via POV c/o left breast abscess. Pt states that she has had to have surgery in the past for same. Pt is currently in NAD.

## 2022-04-13 NOTE — ED Provider Notes (Signed)
[facility] Provider Note    Event Date/Time   First MD Initiated Contact with Patient 04/13/22 1111     (approximate)   History   Abscess and Breast Pain   HPI  Emma Gonzalez is a 31 y.o. female with a past history of left breast abscess who comes ED complaining of pain swelling and redness in the left breast.  No fever.  No chest pain or shortness of breath.  No recent trauma or wounds.  Symptoms have been gradual onset over the past few days.  Reviewed outside records, seeing that patient had incision and drainage in the OR in May 2012 by general surgery and again in September 2013 by general surgery.  Patient reports that since then she has had intermittent episodes of swelling and spontaneous purulent drainage from the area but today felt more severe.     Physical Exam   Triage Vital Signs: ED Triage Vitals  Enc Vitals Group     BP 04/13/22 0917 107/88     Pulse Rate 04/13/22 0917 73     Resp 04/13/22 0917 18     Temp 04/13/22 0917 98.4 F (36.9 C)     Temp Source 04/13/22 0917 Oral     SpO2 04/13/22 0917 100 %     Weight 04/13/22 0913 174 lb 2.6 oz (79 kg)     Height 04/13/22 0913 5\' 6"  (1.676 m)     Head Circumference --      Peak Flow --      Pain Score 04/13/22 0909 9     Pain Loc --      Pain Edu? --      Excl. in GC? --     Most recent vital signs: Vitals:   04/13/22 0917 04/13/22 1244  BP: 107/88 95/72  Pulse: 73 79  Resp: 18 16  Temp: 98.4 F (36.9 C) 97.9 F (36.6 C)  SpO2: 100% 100%   Examined with nurse 06/13/22  at bedside. General: Awake, no distress.  CV:  Good peripheral perfusion.  Regular rate rhythm Resp:  Normal effort.  Clear to auscultation bilaterally Abd:  No distention.  Other:  Left breast has a 3 cm area of induration tenderness erythema and fluctuance medial to the areola with a pustular head developing.  No nipple retraction or drainage from the nipple.  No fluid is expressed from the nipple.   I am able to palpate deep to the area of cellulitis and abscess, and feel the tissues are soft and the abscess is superficial and does not extend deep through the breast tissue or to the chest wall.   ED Results / Procedures / Treatments   Labs (all labs ordered are listed, but only abnormal results are displayed) Labs Reviewed  CBC WITH DIFFERENTIAL/PLATELET  BASIC METABOLIC PANEL     RADIOLOGY    PROCEDURES:  .Michelle NasutiIncision and Drainage  Date/Time: 04/13/2022 1:11 PM  Performed by: 06/13/2022, MD Authorized by: Sharman Cheek, MD   Consent:    Consent obtained:  Verbal   Consent given by:  Patient   Risks discussed:  Bleeding, infection, incomplete drainage and pain   Alternatives discussed:  Alternative treatment, delayed treatment and observation Universal protocol:    Patient identity confirmed:  Verbally with patient Location:    Type:  Abscess   Size:  3 cm   Location:  Trunk   Trunk location:  L breast Pre-procedure details:    Skin preparation:  Povidone-iodine  Sedation:    Sedation type:  None Anesthesia:    Anesthesia method:  Local infiltration and topical application   Topical anesthetic:  EMLA cream   Local anesthetic:  Lidocaine 1% w/o epi Procedure type:    Complexity:  Complex Procedure details:    Incision types:  Single straight   Incision depth:  Dermal   Wound management:  Probed and deloculated and irrigated with saline   Drainage:  Purulent   Drainage amount:  Moderate   Wound treatment:  Wound left open   Packing materials:  None Post-procedure details:    Procedure completion:  Tolerated well, no immediate complications    MEDICATIONS ORDERED IN ED: Medications  lidocaine-prilocaine (EMLA) cream ( Topical Given 04/13/22 1143)  lidocaine (PF) (XYLOCAINE) 1 % injection 5 mL (5 mLs Intradermal Given by Other 04/13/22 1228)     IMPRESSION / MDM / ASSESSMENT AND PLAN / ED COURSE  I reviewed the triage vital signs and the nursing  notes.                             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  This was found to be very superficial on exam, she is not septic, and bedside incision and drainage was performed.  Will initiate antibiotics and recommend outpatient follow-up.      FINAL CLINICAL IMPRESSION(S) / ED DIAGNOSES   Final diagnoses:  Abscess of left breast     Rx / DC Orders   ED Discharge Orders          Ordered    sulfamethoxazole-trimethoprim (BACTRIM DS) 800-160 MG tablet  2 times daily        04/13/22 1305    cephALEXin (KEFLEX) 500 MG capsule  3 times daily        04/13/22 1305             Note:  This document was prepared using Dragon voice recognition software and may include unintentional dictation errors.   Sharman Cheek, MD 04/13/22 1312

## 2022-04-13 NOTE — ED Notes (Signed)
This RN and dr. Scotty Court at bedside for I&D.

## 2022-07-10 ENCOUNTER — Encounter: Payer: Self-pay | Admitting: Emergency Medicine

## 2022-07-10 ENCOUNTER — Other Ambulatory Visit: Payer: Self-pay

## 2022-07-10 ENCOUNTER — Emergency Department
Admission: EM | Admit: 2022-07-10 | Discharge: 2022-07-10 | Disposition: A | Discharge status: Home / Self Care | Payer: Self-pay | Attending: Emergency Medicine | Admitting: Emergency Medicine

## 2022-07-10 DIAGNOSIS — K047 Periapical abscess without sinus: Secondary | ICD-10-CM

## 2022-07-10 DIAGNOSIS — K0381 Cracked tooth: Secondary | ICD-10-CM | POA: Insufficient documentation

## 2022-07-10 MED ORDER — ONDANSETRON 4 MG PO TBDP
4.0000 mg | ORAL_TABLET | Freq: Once | ORAL | Status: AC
  Administered 2022-07-10: 4 mg via ORAL
  Filled 2022-07-10: qty 1

## 2022-07-10 MED ORDER — CHLORHEXIDINE GLUCONATE 0.12 % MT SOLN: 15.0000 mL | Freq: Two times a day (BID) | OROMUCOSAL | 0 refills | Status: AC

## 2022-07-10 MED ORDER — OXYCODONE-ACETAMINOPHEN 5-325 MG PO TABS
1.0000 | ORAL_TABLET | ORAL | Status: DC | PRN
  Administered 2022-07-10: 1 via ORAL
  Filled 2022-07-10: qty 1

## 2022-07-10 MED ORDER — AMOXICILLIN-POT CLAVULANATE 875-125 MG PO TABS: 1.0000 | ORAL_TABLET | Freq: Two times a day (BID) | ORAL | 0 refills | Status: AC

## 2022-07-10 MED ORDER — OXYCODONE HCL 5 MG PO TABS: 5.0000 mg | ORAL_TABLET | Freq: Four times a day (QID) | ORAL | 0 refills | Status: AC | PRN

## 2022-07-10 NOTE — ED Provider Notes (Signed)
[facility] Provider Note    Event Date/Time   First MD Initiated Contact with Patient 07/10/22 0940     (approximate)   History   Dental Pain   HPI  Emma Gonzalez is a 31 y.o. female who comes in with concerns for dental pain.  Patient does have a history of left breast abscess.  She reports 2 days ago she noted that her broken tooth had some swelling urine and she reports some increasing swelling and pain.  Patient reports his pain got better with the oxycodone that was given.  Denies any fevers.  Still able to swallow.   Physical Exam   Triage Vital Signs: ED Triage Vitals  Enc Vitals Group     BP 07/10/22 0720 127/87     Pulse Rate 07/10/22 0720 77     Resp 07/10/22 0720 20     Temp 07/10/22 0720 98.7 F (37.1 C)     Temp Source 07/10/22 0720 Oral     SpO2 07/10/22 0720 100 %     Weight 07/10/22 0718 150 lb (68 kg)     Height 07/10/22 0718 5\' 6"  (1.676 m)     Head Circumference --      Peak Flow --      Pain Score 07/10/22 0718 10     Pain Loc --      Pain Edu? --      Excl. in GC? --     Most recent vital signs: Vitals:   07/10/22 0720  BP: 127/87  Pulse: 77  Resp: 20  Temp: 98.7 F (37.1 C)  SpO2: 100%     General: Awake, no distress.  CV:  Good peripheral perfusion.  Resp:  Normal effort.  Abd:  No distention.  Other:  Patient has periapical abscess noted on the back right molar tooth that is fractured.  She is got no tenderness underneath the tongue.  She is a little bit of swelling noted on the right mandible area reflective of this periapical abscess   ED Results / Procedures / Treatments   Labs (all labs ordered are listed, but only abnormal results are displayed) Labs Reviewed - No data to display   EKG  My interpretation of EKG:    RADIOLOGY I have reviewed the xray personally and agree with radiology read   PROCEDURES:  Critical Care performed: No  ..Incision and Drainage  Date/Time:  07/10/2022 9:56 AM  Performed by: 07/12/2022, MD Authorized by: Concha Se, MD   Consent:    Consent obtained:  Verbal   Consent given by:  Patient   Risks discussed:  Bleeding, incomplete drainage, pain, infection and damage to other organs   Alternatives discussed:  No treatment Universal protocol:    Patient identity confirmed:  Verbally with patient Location:    Type:  Abscess Sedation:    Sedation type:  None Anesthesia:    Anesthesia method:  None Procedure type:    Complexity:  Simple Procedure details:    Incision types:  Stab incision   Drainage:  Purulent and bloody   Wound treatment:  Wound left open Post-procedure details:    Procedure completion:  Tolerated well, no immediate complications    MEDICATIONS ORDERED IN ED: Medications  oxyCODONE-acetaminophen (PERCOCET/ROXICET) 5-325 MG per tablet 1 tablet (1 tablet Oral Given 07/10/22 0722)  ondansetron (ZOFRAN-ODT) disintegrating tablet 4 mg (4 mg Oral Given 07/10/22 07/12/22)     IMPRESSION / MDM / ASSESSMENT AND  PLAN / ED COURSE  I reviewed the triage vital signs and the nursing notes.   Patient's presentation is most consistent with acute, uncomplicated illness.   Patient comes in with periapical abscess.  Do not see any evidence of Ludwick's angina, retropharyngeal abscess.  This was I &D with pus coming out of it.  Patient will be placed on Augmentin and few oxycodone to help with pain.  She understands not to drive or work on this. She udnerstands she needs to see dentist to help with extraction of tooth  We discussed return precautions       FINAL CLINICAL IMPRESSION(S) / ED DIAGNOSES   Final diagnoses:  Dental abscess     Rx / DC Orders   ED Discharge Orders          Ordered    oxyCODONE (ROXICODONE) 5 MG immediate release tablet  Every 6 hours PRN        07/10/22 0955    amoxicillin-clavulanate (AUGMENTIN) 875-125 MG tablet  2 times daily        07/10/22 0955              Note:  This document was prepared using Dragon voice recognition software and may include unintentional dictation errors.   Vanessa [place], MD 07/10/22 [phone]

## 2022-07-10 NOTE — ED Triage Notes (Signed)
Pt reports broke a tooth to her right upper jaw and now has more pain and swelling.

## 2022-07-10 NOTE — Discharge Instructions (Addendum)
Take antibiotics and call a dental clinic for followup as soon as possible  return to ER for fevers, worsening swelling or any other concerns.   OPTIONS FOR DENTAL FOLLOW UP CARE  [place] [facility] - Local Safety Net Dental Clinics [email or web address]   [facility] [phone])  Sharl Ma [phone])  [facility] [phone])  [facility] [phone])  [facility] [phone]) This clinic caters to the indigent population and is on a lottery system. Location: [employer] [facility], [employer], [address], [place] Clinic Hours: Wednesdays from 6pm - 9pm, patients seen by a lottery system. For dates, call or go to [email or web address] Services: Cleanings, fillings and simple extractions. Payment Options: DENTAL WORK IS FREE OF CHARGE. Bring proof of income or support. Best way to get seen: Arrive at 5:15 pm - this is a lottery, NOT first come/first serve, so arriving earlier will not increase your chances of being seen.     [facility] [phone] Select option 1 for emergencies   Location: [facility], [place], [address] [place] Clinic Hours: No walk-ins accepted - call the day before to schedule an appointment. Check in times are 9:30 am and 1:30 pm. Services: Simple extractions, temporary fillings, pulpectomy/pulp debridement, uncomplicated abscess drainage. Payment Options: PAYMENT IS DUE AT THE TIME OF SERVICE.  Fee is usually $100-200, additional surgical procedures (e.g. abscess drainage) may be extra. Cash, checks, Visa/MasterCard accepted.  Can file Medicaid if patient is covered for dental - patient should call case worker to check. No discount for [facility] patients. Best way to get seen: MUST call the day before  and get onto the schedule. Can usually be seen the next 1-2 days. No walk-ins accepted.     [facility] [phone]   Location: [facility], [address] [place] Clinic Hours: M, W, Th, F 8am or 1:30pm, Tues 9a or 1:30 - first come/first served. Services: Simple extractions, temporary fillings, uncomplicated abscess drainage.  You do not need to be an [facility] resident. Payment Options: PAYMENT IS DUE AT THE TIME OF SERVICE. Dental insurance, otherwise sliding scale - bring proof of income or support. Depending on income and treatment needed, cost is usually $50-200. Best way to get seen: Arrive early as it is first come/first served.     [facility] [phone]   Location: [address] Clinic Hours: Mon-Thu 8a-5p Services: Most basic dental services including extractions and fillings. Payment Options: PAYMENT IS DUE AT THE TIME OF SERVICE. Sliding scale, up to 50% off - bring proof if income or support. Medicaid with dental option accepted. Best way to get seen: Call to schedule an appointment, can usually be seen within 2 weeks OR they will try to see walk-ins - show up at 8a or 2p (you may have to wait).     [facility] [phone] [place] RESIDENTS ONLY   Location: [facility], [address], [place], [place] [address] Clinic Hours: By appointment only. Monday - Thursday 8am-5pm, Friday 8am-12pm Services: Cleanings, fillings, extractions. Payment Options: PAYMENT IS DUE AT THE TIME OF SERVICE. Cash, Visa or MasterCard. Sliding scale - $30 minimum per service. Best way to get seen: Come in to office, complete packet and make an appointment - need proof of income or support monies for each household member and proof of  [facility] residence. Usually takes about a month to get in.     [facility] [phone]   Location: [address] [place] Clinic Hours: Walk-in Urgent Care Dental Services are offered Monday-Friday mornings only. The numbers of emergencies accepted daily is limited to the number of providers available. Maximum 15 - Mondays, Wednesdays & Thursdays Maximum 10 - Tuesdays & Fridays Services: You do not need to be a [facility] resident to be seen for a dental emergency. Emergencies are defined as pain, swelling, abnormal bleeding, or dental trauma. Walkins will receive x-rays if needed. NOTE: Dental cleaning is not an emergency. Payment Options: PAYMENT IS DUE AT THE TIME OF SERVICE. Minimum co-pay is $40.00 for uninsured patients. Minimum co-pay is $3.00 for Medicaid with dental coverage. Dental Insurance is accepted and must be presented at time of visit. Medicare does not cover dental. Forms of payment: Cash, credit card, checks. Best way to get seen: If not previously registered with the clinic, walk-in dental registration begins at 7:15 am and is on a first come/first serve basis. If previously registered with the clinic, call to make an appointment.     [facility] [place] ONLY   Location: [address], [place], [place] Clinic Hours: Mon-Thu 10a-2p Services: Extractions only! Payment Options: FREE (donations accepted) - bring proof of income or support Best way to get seen: Call and schedule an appointment OR come at 8am on the 1st Monday of every month (except for holidays) when it is first come/first served.     [facility] [phone]   Location: [place], [place] Clinic Hours: Friday mornings Services, Payment Options, Best way to get seen: Call for info  Take oxycodone as prescribed. Do not drink alcohol, drive or participate in any other potentially dangerous activities while taking this medication as it may make you sleepy. Do not take this medication with any  other sedating medications, either prescription or over-the-counter. If you were prescribed Percocet or Vicodin, do not take these with acetaminophen (Tylenol) as it is already contained within these medications.  This medication is an opiate (or narcotic) pain medication and can be habit forming. Use it as little as possible to achieve adequate pain control. Do not use or use it with extreme caution if you have a history of opiate abuse or dependence. If you are on a pain contract with your primary care doctor or a pain specialist, be sure to let them know you were prescribed this medication today from the Emergency Department. This medication is intended for your use only - do not give any to anyone else and keep it in a secure place where nobody else, especially children, have access to it.

## 2023-06-05 ENCOUNTER — Ambulatory Visit: Payer: Self-pay

## 2023-08-09 ENCOUNTER — Encounter: Payer: Self-pay | Admitting: *Deleted

## 2023-08-09 ENCOUNTER — Ambulatory Visit: Payer: 59

## 2023-08-09 ENCOUNTER — Ambulatory Visit
Admission: EM | Admit: 2023-08-09 | Discharge: 2023-08-09 | Disposition: A | Discharge status: Home / Self Care | Payer: 59 | Attending: Family Medicine | Admitting: Family Medicine

## 2023-08-09 ENCOUNTER — Other Ambulatory Visit: Payer: Self-pay

## 2023-08-09 DIAGNOSIS — R0789 Other chest pain: Secondary | ICD-10-CM | POA: Diagnosis not present

## 2023-08-09 DIAGNOSIS — R1013 Epigastric pain: Secondary | ICD-10-CM | POA: Diagnosis not present

## 2023-08-09 MED ORDER — FAMOTIDINE 20 MG PO TABS: 20.0000 mg | ORAL_TABLET | Freq: Two times a day (BID) | ORAL | 0 refills | Status: DC

## 2023-08-09 MED ORDER — OMEPRAZOLE 20 MG PO CPDR: 20.0000 mg | DELAYED_RELEASE_CAPSULE | Freq: Every day | ORAL | 0 refills | Status: DC

## 2023-08-09 MED ORDER — LIDOCAINE VISCOUS HCL 2 % MT SOLN
15.0000 mL | Freq: Once | OROMUCOSAL | Status: AC
Start: 2023-08-09 — End: 2023-08-09
  Administered 2023-08-09: 15 mL via OROMUCOSAL

## 2023-08-09 MED ORDER — ALUM & MAG HYDROXIDE-SIMETH 200-200-20 MG/5ML PO SUSP
30.0000 mL | Freq: Once | ORAL | Status: AC
Start: 2023-08-09 — End: 2023-08-09
  Administered 2023-08-09: 30 mL via ORAL

## 2023-08-09 NOTE — ED Notes (Signed)
Attempted to collect blood x3 .Will get second staff to collect.

## 2023-08-09 NOTE — ED Notes (Signed)
DR Rachael Darby aware Pt refused

## 2023-08-09 NOTE — ED Notes (Signed)
Attempted to collect blood in R hand. Pt declined blood draw.

## 2023-08-09 NOTE — Discharge Instructions (Addendum)
Unfortunately we were not able to get your lab today. Your chest xray did not show evidence of pneumonia or fluid in your lungs or around your heart.  The radiologist has not yet read your xray but if they find something that I didn't, I will call you.    Go to ED for red flag symptoms, including; worsening chest pain that is accompanied by nausea, vomiting, sweating, pain in your neck or jaw. Severe headaches, vision changes, numbness/weakness in part of the body, lethargy, confusion, terrible vomiting, severe dehydration, breathing difficulty, severe persistent abdominal, feeling faint or passing out, dizziness, etc. You should especially go to the ED for sudden acute worsening of condition if you do not elect to go at this time.

## 2023-08-09 NOTE — ED Provider Notes (Signed)
[facility]    CSN: [id number] Arrival date & time: 08/09/23  1136      History   Chief Complaint Chief Complaint  Patient presents with   Chest Pain    HPI Emma Gonzalez is a 32 y.o. female.   HPI  Emma Gonzalez here for central chest pain that started while at work around 800 AM. 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  Has burning sensation in her chest. Feels like an air bubble or something is stuck right there. She has burped a few times which helped. She has sharp pains at times. She took a BC powder for a headache around 630 AM.      Associated Symptoms: Heartburn/food sticking  Patient Denies: Nausea, Vomiting, Diaphoresis, Shortness of breath, Pleuritic pain, Leg swelling, Syncope, Recent immobility, Wheezing, Hemoptysis, Trauma, Fever, and Cough  Pt reports no history of PE, DVT, DM, HTN, cancer, recent surgery or travel   Tobacco use: current smoker      History reviewed. No pertinent past medical history.  There are no problems to display for this patient.   Past Surgical History:  Procedure Laterality Date   BREAST SURGERY     TONSILLECTOMY      OB History   No obstetric history on file.      Home Medications    Prior to Admission medications   Medication Sig Start Date End Date Taking? Authorizing Provider  famotidine (PEPCID) 20 MG tablet Take 1 tablet (20 mg total) by mouth 2 (two) times daily. 08/09/23  Yes Federico Maiorino, DO  omeprazole (PRILOSEC) 20 MG capsule Take 1 capsule (20 mg total) by mouth daily. 08/09/23  Yes Katha Cabal, DO    Family History History reviewed. No pertinent family history.  Social History Social History   Tobacco Use   Smoking status: Every Day    Current packs/day: 0.50    Types: Cigarettes   Smokeless tobacco: Never  Vaping Use   Vaping status: Never Used  Substance Use Topics   Alcohol use: Yes    Comment: 2x/mo   Drug use: Never     Allergies    Patient has no known allergies.   Review of Systems Review of Systems :negative unless otherwise stated in HPI.      Physical Exam Triage Vital Signs ED Triage Vitals  Encounter Vitals Group     BP 08/09/23 1141 (!) 135/92     Systolic BP Percentile --      Diastolic BP Percentile --      Pulse Rate 08/09/23 1141 89     Resp --      Temp 08/09/23 1146 98.2 F (36.8 C)     Temp src --      SpO2 08/09/23 1141 100 %     Weight --      Height --      Head Circumference --      Peak Flow --      Pain Score 08/09/23 1142 9     Pain Loc --      Pain Education --      Exclude from Growth Chart --    No data found.  Updated Vital Signs BP (!) 135/92   Pulse 89   Temp 98.2 F (36.8 C)   LMP 07/31/2023   SpO2 100%   Visual Acuity Right Eye Distance:   Left Eye Distance:   Bilateral Distance:    Right Eye Near:  Left Eye Near:    Bilateral Near:     Physical Exam  GEN: pleasant well appearing female, in no acute distress  CV: regular rate and rhythm,  no murmurs, rubs or gallops  appreciated, no JVP  CHEST WALL: tender to palpation central anterior chest wall  RESP: no increased work of breathing, clear to ascultation bilaterally ABD: Bowel sounds present. Soft,  mild epigastric tenderness,  non-distended.  MSK: no edema, or calf tenderness SKIN: warm, dry, no rash on visible skin NEURO: alert, moves all extremities appropriately PSYCH: Normal affect, appropriate speech and behavior   UC Treatments / Results  Labs (all labs ordered are listed, but only abnormal results are displayed) Labs Reviewed - No data to display  EKG  See my EKG interpretation in the MDM section  Radiology DG Chest 2 View  Result Date: 08/09/2023 CLINICAL DATA:  Acute onset of chest and epigastric pain. EXAM: CHEST - 2 VIEW COMPARISON:  None Available. FINDINGS: The heart size and mediastinal contours are within normal limits. Both lungs are clear. The visualized skeletal  structures are unremarkable. IMPRESSION: Normal exam. Electronically Signed   By: Danae Orleans M.D.   On: 08/09/2023 14:26     Procedures Procedures (including critical care time)  Medications Ordered in UC Medications  alum & mag hydroxide-simeth (MAALOX/MYLANTA) 200-200-20 MG/5ML suspension 30 mL (30 mLs Oral Given 08/09/23 1337)  lidocaine (XYLOCAINE) 2 % viscous mouth solution 15 mL (15 mLs Mouth/Throat Given 08/09/23 1337)    Initial Impression / Assessment and Plan / UC Course  I have reviewed the triage vital signs and the nursing notes.  Pertinent labs & imaging results that were available during my care of the patient were reviewed by me and considered in my medical decision making (see chart for details).       Patient is a 32 y.o. female with history of tobacco use  who presents with acute central chest pain that started about 4 hours ago while at work.  She is highly hypertensive at 145/92.  Satting well on room air.  She is afebrile.  Vitals otherwise normal.  Differential diagnosis includes, but is not limited to, ACS, aortic dissection, pulmonary embolism, cardiac tamponade, pneumothorax, pneumonia, pericarditis/myocarditis, GI-related causes including esophagitis/gastritis, and musculoskeletal chest wall pain   Lynneah has no history of PE.  Able to [facility] pt out therefore doubt PE.  EKG obtained and is without ST elevations or concern for ischemia, NSR with incomplete right bundle branch block. No EKG available for comparison.  CBC, CMP, lipase and troponin ordered but patient declined labs after 2 attempts to obtain. Chest x-ray did not show bacterial pneumonia, pleural effusions or pneumothorax. I do not think his chest pain is cardiopulmonary related. No other anxiety signs or symptoms. Exam showing reproducible pain could be MSK related.   Additonally, she took a BV powder this morning and symptoms started an hour or so later.  Possibly, GERD or gas related.  She was  given a GI cocktail with some relief. Prescribed Pepcid with omeprazole. She is to use Gas-X as needed.   ED precautions given and understanding voiced. Discussed MDM, treatment plan and plan for follow-up with patient/parent who agrees with plan.      Final Clinical Impressions(s) / UC Diagnoses   Final diagnoses:  Atypical chest pain     Discharge Instructions      Unfortunately we were not able to get your lab today. Your chest xray did not show evidence of pneumonia  or fluid in your lungs or around your heart.  The radiologist has not yet read your xray but if they find something that I didn't, I will call you.    Go to ED for red flag symptoms, including; worsening chest pain that is accompanied by nausea, vomiting, sweating, pain in your neck or jaw. Severe headaches, vision changes, numbness/weakness in part of the body, lethargy, confusion, terrible vomiting, severe dehydration, breathing difficulty, severe persistent abdominal, feeling faint or passing out, dizziness, etc. You should especially go to the ED for sudden acute worsening of condition if you do not elect to go at this time.        ED Prescriptions     Medication Sig Dispense Auth. Provider   famotidine (PEPCID) 20 MG tablet Take 1 tablet (20 mg total) by mouth 2 (two) times daily. 30 tablet Zayed Griffie, DO   omeprazole (PRILOSEC) 20 MG capsule Take 1 capsule (20 mg total) by mouth daily. 30 capsule Katha Cabal, DO      PDMP not reviewed this encounter.   Katha Cabal, DO 08/09/23 1656

## 2023-08-09 NOTE — ED Triage Notes (Signed)
PT reports CP started while at work . Pt reports Epi gaster pain. Pt took a BC powder for HA and continued to have Epigastric pain.

## 2023-09-25 ENCOUNTER — Ambulatory Visit
Admission: EM | Admit: 2023-09-25 | Discharge: 2023-09-25 | Disposition: A | Discharge status: Home / Self Care | Payer: 59 | Attending: Physician Assistant | Admitting: Physician Assistant

## 2023-09-25 ENCOUNTER — Encounter: Payer: Self-pay | Admitting: Emergency Medicine

## 2023-09-25 DIAGNOSIS — J069 Acute upper respiratory infection, unspecified: Secondary | ICD-10-CM | POA: Diagnosis not present

## 2023-09-25 DIAGNOSIS — R051 Acute cough: Secondary | ICD-10-CM | POA: Insufficient documentation

## 2023-09-25 DIAGNOSIS — J029 Acute pharyngitis, unspecified: Secondary | ICD-10-CM | POA: Diagnosis not present

## 2023-09-25 DIAGNOSIS — R0981 Nasal congestion: Secondary | ICD-10-CM | POA: Diagnosis not present

## 2023-09-25 LAB — GROUP A STREP BY PCR: Group A Strep by PCR: NOT DETECTED

## 2023-09-25 LAB — RESP PANEL BY RT-PCR (FLU A&B, COVID) ARPGX2
Influenza A by PCR: NEGATIVE
Influenza B by PCR: NEGATIVE
SARS Coronavirus 2 by RT PCR: NEGATIVE

## 2023-09-25 MED ORDER — IPRATROPIUM BROMIDE 0.06 % NA SOLN: 2.0000 | Freq: Four times a day (QID) | NASAL | 0 refills | Status: DC

## 2023-09-25 MED ORDER — PROMETHAZINE-DM 6.25-15 MG/5ML PO SYRP: 5.0000 mL | ORAL_SOLUTION | Freq: Four times a day (QID) | ORAL | 0 refills | Status: DC | PRN

## 2023-09-25 NOTE — ED Triage Notes (Signed)
 Patient c/o productive cough, nasal congestion and sore throat x 2 days.

## 2023-09-25 NOTE — ED Provider Notes (Signed)
 [facility]    CSN: [id number] Arrival date & time: 09/25/23  0908      History   Chief Complaint Chief Complaint  Patient presents with   Nasal Congestion   Sore Throat   Cough    HPI TWILA RAPPA is a 33 y.o. female presenting for 2-day history of fatigue, cough, congestion and sore throat.  Denies fever, chest pain, wheezing or shortness of breath.  Reports sick contacts at work.  Has not had any medication for symptoms.  No other complaints.  HPI  History reviewed. No pertinent past medical history.  There are no active problems to display for this patient.   Past Surgical History:  Procedure Laterality Date   BREAST SURGERY     TONSILLECTOMY      OB History   No obstetric history on file.      Home Medications    Prior to Admission medications   Medication Sig Start Date End Date Taking? Authorizing Provider  ipratropium (ATROVENT ) 0.06 % nasal spray Place 2 sprays into both nostrils 4 (four) times daily. 09/25/23  Yes Arvis Huxley B, PA-C  promethazine -dextromethorphan (PROMETHAZINE -DM) 6.25-15 MG/5ML syrup Take 5 mLs by mouth 4 (four) times daily as needed. 09/25/23  Yes Arvis Huxley NOVAK, PA-C  famotidine  (PEPCID ) 20 MG tablet Take 1 tablet (20 mg total) by mouth 2 (two) times daily. 08/09/23   Brimage, Vondra, DO  omeprazole  (PRILOSEC) 20 MG capsule Take 1 capsule (20 mg total) by mouth daily. 08/09/23   Brimage, Vondra, DO    Family History No family history on file.  Social History Social History   Tobacco Use   Smoking status: Every Day    Current packs/day: 0.50    Types: Cigarettes   Smokeless tobacco: Never  Vaping Use   Vaping status: Never Used  Substance Use Topics   Alcohol use: Yes    Comment: 2x/mo   Drug use: Never     Allergies   Patient has no known allergies.   Review of Systems Review of Systems  Constitutional:  Positive for fatigue. Negative for chills, diaphoresis and fever.  HENT:  Positive  for congestion, rhinorrhea and sore throat. Negative for ear pain, sinus pressure and sinus pain.   Respiratory:  Positive for cough. Negative for shortness of breath.   Gastrointestinal:  Negative for abdominal pain, nausea and vomiting.  Musculoskeletal:  Negative for arthralgias and myalgias.  Skin:  Negative for rash.  Neurological:  Negative for weakness and headaches.  Hematological:  Negative for adenopathy.     Physical Exam Triage Vital Signs ED Triage Vitals  Encounter Vitals Group     BP 09/25/23 0947 124/88     Systolic BP Percentile --      Diastolic BP Percentile --      Pulse Rate 09/25/23 0947 87     Resp 09/25/23 0947 16     Temp 09/25/23 0947 99.1 F (37.3 C)     Temp Source 09/25/23 0947 Oral     SpO2 09/25/23 0947 100 %     Weight --      Height --      Head Circumference --      Peak Flow --      Pain Score 09/25/23 0946 7     Pain Loc --      Pain Education --      Exclude from Growth Chart --    No data found.  Updated Vital Signs BP 124/88 (  BP Location: Left Arm)   Pulse 87   Temp 99.1 F (37.3 C) (Oral)   Resp 16   LMP 08/31/2023   SpO2 100%     Physical Exam Vitals and nursing note reviewed.  Constitutional:      General: She is not in acute distress.    Appearance: Normal appearance. She is not ill-appearing or toxic-appearing.  HENT:     Head: Normocephalic and atraumatic.     Right Ear: Tympanic membrane, ear canal and external ear normal.     Left Ear: Tympanic membrane, ear canal and external ear normal.     Nose: Congestion present.     Mouth/Throat:     Mouth: Mucous membranes are moist.     Pharynx: Oropharynx is clear. Posterior oropharyngeal erythema present.  Eyes:     General: No scleral icterus.       Right eye: No discharge.        Left eye: No discharge.     Conjunctiva/sclera: Conjunctivae normal.  Cardiovascular:     Rate and Rhythm: Normal rate and regular rhythm.     Heart sounds: Normal heart sounds.   Pulmonary:     Effort: Pulmonary effort is normal. No respiratory distress.     Breath sounds: Normal breath sounds.  Musculoskeletal:     Cervical back: Neck supple.  Skin:    General: Skin is dry.  Neurological:     General: No focal deficit present.     Mental Status: She is alert. Mental status is at baseline.     Motor: No weakness.     Gait: Gait normal.  Psychiatric:        Mood and Affect: Mood normal.        Behavior: Behavior normal.      UC Treatments / Results  Labs (all labs ordered are listed, but only abnormal results are displayed) Labs Reviewed  RESP PANEL BY RT-PCR (FLU A&B, COVID) ARPGX2  GROUP A STREP BY PCR    EKG   Radiology No results found.  Procedures Procedures (including critical care time)  Medications Ordered in UC Medications - No data to display  Initial Impression / Assessment and Plan / UC Course  I have reviewed the triage vital signs and the nursing notes.  Pertinent labs & imaging results that were available during my care of the patient were reviewed by me and considered in my medical decision making (see chart for details).   33 year old female presents for 2-day history of fatigue, cough, congestion and sore throat.  Denies fever.  Vitals are stable and normal and she is overall well-appearing.  Mild nasal congestion and erythema posterior pharynx.  Chest clear.  Respiratory panel and strep testing obtained.  All negative testing.  Viral URI.  Supportive care encouraged with increasing rest and fluids.  Sent Promethazine  DM and Atrovent  nasal spray to pharmacy and reviewed return precautions.  Work note given.   Final Clinical Impressions(s) / UC Diagnoses   Final diagnoses:  Viral upper respiratory tract infection  Acute cough  Sore throat  Nasal congestion     Discharge Instructions      Negative COVID, flu and strep.  URI/COLD SYMPTOMS: Your exam today is consistent with a viral illness. Antibiotics are  not indicated at this time. Use medications as directed, including cough syrup, nasal saline, and decongestants. Your symptoms should improve over the next few days and resolve within 7-10 days. Increase rest and fluids. F/u if symptoms worsen or  predominate such as sore throat, ear pain, productive cough, shortness of breath, or if you develop high fevers or worsening fatigue over the next several days.       ED Prescriptions     Medication Sig Dispense Auth. Provider   ipratropium (ATROVENT ) 0.06 % nasal spray Place 2 sprays into both nostrils 4 (four) times daily. 15 mL Arvis Huxley B, PA-C   promethazine -dextromethorphan (PROMETHAZINE -DM) 6.25-15 MG/5ML syrup Take 5 mLs by mouth 4 (four) times daily as needed. 118 mL Arvis Huxley NOVAK, PA-C      PDMP not reviewed this encounter.   Arvis Huxley NOVAK, PA-C 09/25/23 1047

## 2023-09-25 NOTE — Discharge Instructions (Addendum)
-  Negative COVID, flu and strep.  URI/COLD SYMPTOMS: Your exam today is consistent with a viral illness. Antibiotics are not indicated at this time. Use medications as directed, including cough syrup, nasal saline, and decongestants. Your symptoms should improve over the next few days and resolve within 7-10 days. Increase rest and fluids. F/u if symptoms worsen or predominate such as sore throat, ear pain, productive cough, shortness of breath, or if you develop high fevers or worsening fatigue over the next several days.

## 2023-10-16 ENCOUNTER — Ambulatory Visit
Admission: EM | Admit: 2023-10-16 | Discharge: 2023-10-16 | Disposition: A | Discharge status: Home / Self Care | Payer: BC Managed Care – PPO | Attending: Family Medicine | Admitting: Family Medicine

## 2023-10-16 ENCOUNTER — Encounter: Payer: Self-pay | Admitting: Emergency Medicine

## 2023-10-16 DIAGNOSIS — J069 Acute upper respiratory infection, unspecified: Secondary | ICD-10-CM | POA: Diagnosis not present

## 2023-10-16 LAB — RESP PANEL BY RT-PCR (FLU A&B, COVID) ARPGX2
Influenza A by PCR: NEGATIVE
Influenza B by PCR: NEGATIVE
SARS Coronavirus 2 by RT PCR: NEGATIVE

## 2023-10-16 MED ORDER — ALBUTEROL SULFATE HFA 108 (90 BASE) MCG/ACT IN AERS: 2.0000 | INHALATION_SPRAY | RESPIRATORY_TRACT | 0 refills | Status: AC | PRN

## 2023-10-16 NOTE — ED Triage Notes (Signed)
Pt presents with fatigue, cough, nausea, bodyaches and headache since yesterday.

## 2023-10-16 NOTE — Discharge Instructions (Addendum)
 Your COVID and influenza tests are negative.    You can take Tylenol and/or Ibuprofen as needed for fever reduction and pain relief.    For cough: honey 1/2 to 1 teaspoon (you can dilute the honey in water or another fluid).  You can also use guaifenesin and dextromethorphan for cough. You can use a humidifier for chest congestion and cough.  If you don't have a humidifier, you can sit in the bathroom with the hot shower running.      For sore throat: try warm salt water gargles, Mucinex sore throat cough drops or cepacol lozenges, throat spray, warm tea or water with lemon/honey, popsicles or ice, or OTC cold relief medicine for throat discomfort. You can also purchase chloraseptic spray at the pharmacy or dollar store.   For congestion: take a daily anti-histamine like Zyrtec, Claritin, and a oral decongestant, such as pseudoephedrine.  You can also use Flonase 1-2 sprays in each nostril daily. Afrin is also a good option, if you do not have high blood pressure.    It is important to stay hydrated: drink plenty of fluids (water, gatorade/powerade/pedialyte, juices, or teas) to keep your throat moisturized and help further relieve irritation/discomfort.    Return or go to the Emergency Department if symptoms worsen or do not improve in the next few days

## 2023-10-16 NOTE — ED Provider Notes (Signed)
[facility]    CSN: [id number] Arrival date & time: 10/16/23  4401      History   Chief Complaint Chief Complaint  Patient presents with   Headache   Fatigue   Generalized Body Aches   Cough   Nausea    HPI Emma Gonzalez is a 33 y.o. female.   HPI  History obtained from the patient. Chiquitta presents for sore throat, body aches, cough, hot and cold flashes, cough, nausea and headache that started last night. No vomiting or diarrhea. Took ibuprofen around 9 AM.  No known sick contacts.   She is a cigarette smoker. She has some shortness of breath with chest tightness. No history of asthma.     History reviewed. No pertinent past medical history.  There are no active problems to display for this patient.   Past Surgical History:  Procedure Laterality Date   BREAST SURGERY     TONSILLECTOMY      OB History   No obstetric history on file.      Home Medications    Prior to Admission medications   Medication Sig Start Date End Date Taking? Authorizing Provider  albuterol (VENTOLIN HFA) 108 (90 Base) MCG/ACT inhaler Inhale 2 puffs into the lungs every 4 (four) hours as needed. 10/16/23  Yes Jissel Slavens, DO  famotidine (PEPCID) 20 MG tablet Take 1 tablet (20 mg total) by mouth 2 (two) times daily. 08/09/23   Margene Cherian, Seward Meth, DO  ipratropium (ATROVENT) 0.06 % nasal spray Place 2 sprays into both nostrils 4 (four) times daily. 09/25/23   Shirlee Latch, PA-C  omeprazole (PRILOSEC) 20 MG capsule Take 1 capsule (20 mg total) by mouth daily. 08/09/23   Katha Cabal, DO  promethazine-dextromethorphan (PROMETHAZINE-DM) 6.25-15 MG/5ML syrup Take 5 mLs by mouth 4 (four) times daily as needed. 09/25/23   Shirlee Latch, PA-C    Family History No family history on file.  Social History Social History   Tobacco Use   Smoking status: Every Day    Current packs/day: 0.50    Types: Cigarettes   Smokeless tobacco: Never  Vaping Use   Vaping  status: Never Used  Substance Use Topics   Alcohol use: Yes    Comment: 2x/mo   Drug use: Never     Allergies   Patient has no known allergies.   Review of Systems Review of Systems: negative unless otherwise stated in HPI.      Physical Exam Triage Vital Signs ED Triage Vitals  Encounter Vitals Group     BP 10/16/23 1059 125/82     Systolic BP Percentile --      Diastolic BP Percentile --      Pulse Rate 10/16/23 1059 (!) 106     Resp 10/16/23 1059 16     Temp 10/16/23 1059 99.2 F (37.3 C)     Temp Source 10/16/23 1059 Oral     SpO2 10/16/23 1059 95 %     Weight --      Height --      Head Circumference --      Peak Flow --      Pain Score 10/16/23 1057 8     Pain Loc --      Pain Education --      Exclude from Growth Chart --    No data found.  Updated Vital Signs BP 125/82 (BP Location: Left Arm)   Pulse (!) 106   Temp 99.2 F (37.3 C) (  Oral)   Resp 16   LMP 09/29/2023   SpO2 95%   Visual Acuity Right Eye Distance:   Left Eye Distance:   Bilateral Distance:    Right Eye Near:   Left Eye Near:    Bilateral Near:     Physical Exam GEN:     alert, ill but non-toxic appearing female in no distress    HENT:  mucus membranes moist, oropharyngeal without lesions or erythema, no tonsillar hypertrophy or exudates,  clear nasal discharge, bilateral TM normal EYES:   pupils equal and reactive, no scleral injection or discharge NECK:  normal ROM, +lymphadenopathy, no meningismus   RESP:  no increased work of breathing, clear to auscultation bilaterally CVS:   regular rate and rhythm Skin:   warm and dry, no rash on visible skin    UC Treatments / Results  Labs (all labs ordered are listed, but only abnormal results are displayed) Labs Reviewed  RESP PANEL BY RT-PCR (FLU A&B, COVID) [id number]    EKG   Radiology No results found.  Procedures Procedures (including critical care time)  Medications Ordered in UC Medications - No data to  display  Initial Impression / Assessment and Plan / UC Course  I have reviewed the triage vital signs and the nursing notes.  Pertinent labs & imaging results that were available during my care of the patient were reviewed by me and considered in my medical decision making (see chart for details).       Pt is a 33 y.o. female who presents for 1 day of respiratory symptoms. Emma Gonzalez is afebrile here though had recent. Satting 95% on room air. Overall pt is ill but non-toxic appearing, well hydrated, without respiratory distress. Pulmonary exam is unremarkable.  COVID and influenza panel obtained and was negative. History most consistent with viral respiratory illness. Discussed symptomatic treatment.  Explained lack of efficacy of antibiotics in viral disease.  Typical duration of symptoms discussed. Albuterol for chest tightness and shortness of breath. Work note provided.   Return and ED precautions given and voiced understanding. Discussed MDM, treatment plan and plan for follow-up with patient who agrees with plan.     Final Clinical Impressions(s) / UC Diagnoses   Final diagnoses:  Viral URI with cough     Discharge Instructions      Your COVID and influenza tests are negative. You can take Tylenol and/or Ibuprofen as needed for fever reduction and pain relief.    For cough: honey 1/2 to 1 teaspoon (you can dilute the honey in water or another fluid).  You can also use guaifenesin and dextromethorphan for cough. You can use a humidifier for chest congestion and cough.  If you don't have a humidifier, you can sit in the bathroom with the hot shower running.      For sore throat: try warm salt water gargles, Mucinex sore throat cough drops or cepacol lozenges, throat spray, warm tea or water with lemon/honey, popsicles or ice, or OTC cold relief medicine for throat discomfort. You can also purchase chloraseptic spray at the pharmacy or dollar store.   For congestion: take a  daily anti-histamine like Zyrtec, Claritin, and a oral decongestant, such as pseudoephedrine.  You can also use Flonase 1-2 sprays in each nostril daily. Afrin is also a good option, if you do not have high blood pressure.    It is important to stay hydrated: drink plenty of fluids (water, gatorade/powerade/pedialyte, juices, or teas) to keep your throat moisturized and  help further relieve irritation/discomfort.    Return or go to the Emergency Department if symptoms worsen or do not improve in the next few days      ED Prescriptions     Medication Sig Dispense Auth. Provider   albuterol (VENTOLIN HFA) 108 (90 Base) MCG/ACT inhaler Inhale 2 puffs into the lungs every 4 (four) hours as needed. 6.7 g Katha Cabal, DO      PDMP not reviewed this encounter.   Katha Cabal, DO 10/16/23 1152

## 2023-11-13 ENCOUNTER — Emergency Department
Admission: EM | Admit: 2023-11-13 | Discharge: 2023-11-13 | Disposition: A | Discharge status: Home / Self Care | Attending: Emergency Medicine | Admitting: Emergency Medicine

## 2023-11-13 ENCOUNTER — Ambulatory Visit: Admission: EM | Admit: 2023-11-13 | Discharge: 2023-11-13 | Attending: Family Medicine | Admitting: Family Medicine

## 2023-11-13 ENCOUNTER — Encounter: Payer: Self-pay | Admitting: Emergency Medicine

## 2023-11-13 ENCOUNTER — Other Ambulatory Visit: Payer: Self-pay

## 2023-11-13 ENCOUNTER — Emergency Department

## 2023-11-13 DIAGNOSIS — S0993XA Unspecified injury of face, initial encounter: Secondary | ICD-10-CM | POA: Diagnosis present

## 2023-11-13 DIAGNOSIS — Y92410 Unspecified street and highway as the place of occurrence of the external cause: Secondary | ICD-10-CM | POA: Insufficient documentation

## 2023-11-13 DIAGNOSIS — S0083XA Contusion of other part of head, initial encounter: Secondary | ICD-10-CM | POA: Insufficient documentation
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
## 2023-11-13 NOTE — Discharge Instructions (Signed)
 Please apply ice to affected area.  Please take Tylenol 650 mg up to 4 times a day as needed for pain.  Follow-up with primary care for further evaluation.  Return here for new or worse symptoms.
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
## 2023-11-13 NOTE — Discharge Instructions (Signed)
 As we discussed, there is a possibility that you broke your right zygomatic arch or that you have a fracture going into your right sinus given that you have had blood from your right nare along with swelling and tenderness.  You need a CT scan of your face.  Please go to [facility] for further evaluation.

## 2023-12-12 ENCOUNTER — Encounter: Payer: Self-pay | Admitting: Nurse Practitioner

## 2023-12-12 ENCOUNTER — Ambulatory Visit

## 2023-12-12 DIAGNOSIS — Z113 Encounter for screening for infections with a predominantly sexual mode of transmission: Secondary | ICD-10-CM

## 2023-12-12 DIAGNOSIS — A599 Trichomoniasis, unspecified: Secondary | ICD-10-CM

## 2023-12-12 LAB — WET PREP FOR TRICH, YEAST, CLUE
Trichomonas Exam: NEGATIVE
Yeast Exam: NEGATIVE

## 2023-12-12 MED ORDER — METRONIDAZOLE 500 MG PO TABS: 500.0000 mg | ORAL_TABLET | Freq: Two times a day (BID) | ORAL | Status: AC

## 2023-12-12 NOTE — Progress Notes (Signed)
 Pt is here for STD visit and a contact to [facility]. Wet prep reviewed with patient. The patient was dispensed Metronidazole 500 mg 2x/day for 7 days today. I provided counseling today regarding the medication. We discussed the medication, the side effects and when to call clinic. Condoms given. Patient given the opportunity to ask questions for any clarification. Sonda Primes, RN.

## 2023-12-13 ENCOUNTER — Ambulatory Visit
Admission: EM | Admit: 2023-12-13 | Discharge: 2023-12-13 | Disposition: A | Discharge status: Home / Self Care | Attending: Family Medicine | Admitting: Family Medicine

## 2023-12-13 DIAGNOSIS — S0502XA Injury of conjunctiva and corneal abrasion without foreign body, left eye, initial encounter: Secondary | ICD-10-CM

## 2023-12-13 MED ORDER — EYE WASH OP SOLN
1000.0000 [drp] | OPHTHALMIC | Status: DC | PRN
  Administered 2023-12-13: 1000 [drp] via OPHTHALMIC

## 2023-12-13 NOTE — ED Provider Notes (Signed)
 [facility]    CSN: [id number] Arrival date & time: 12/13/23  1049      History   Chief Complaint Chief Complaint  Patient presents with   Eye Problem    HPI HPI  Emma Gonzalez is a 33 y.o. female.    Emma Gonzalez presents for left eye pain that started after waking up this morning. Has itchy, watery eyes and redness with crusting. Emma Gonzalez feels like something is in her eye but does not see anything. No treatments tried prior to arrival.   Emma Gonzalez does not wear glasses or contacts.  Emma Gonzalez has some light sensitivity and had any trouble seeing  from the eye.  Denies cold symptoms.  Emma Gonzalez has otherwise been well and has no additional concerns today.   History reviewed. No pertinent past medical history.  There are no active problems to display for this patient.   Past Surgical History:  Procedure Laterality Date   BREAST SURGERY     TONSILLECTOMY      OB History   No obstetric history on file.      Home Medications    Prior to Admission medications   Medication Sig Start Date End Date Taking? Authorizing Provider  albuterol (VENTOLIN HFA) 108 (90 Base) MCG/ACT inhaler Inhale 2 puffs into the lungs every 4 (four) hours as needed. 10/16/23  Yes Emma Rada, DO  metroNIDAZOLE (FLAGYL) 500 MG tablet Take 1 tablet (500 mg total) by mouth 2 (two) times daily for 7 doses. 12/12/23 12/16/23 Yes Emma Gonzalez, Emma Jarred, NP  famotidine (PEPCID) 20 MG tablet Take 1 tablet (20 mg total) by mouth 2 (two) times daily. 08/09/23   Emma Gonzalez, Emma Meth, DO  ipratropium (ATROVENT) 0.06 % nasal spray Place 2 sprays into both nostrils 4 (four) times daily. 09/25/23   Shirlee Latch, PA-C    Family History History reviewed. No pertinent family history.  Social History Social History   Tobacco Use   Smoking status: Every Day    Current packs/day: 0.50    Types: Cigarettes   Smokeless tobacco: Never  Vaping Use   Vaping status: Never Used  Substance Use  Topics   Alcohol use: Yes    Comment: 2x/mo   Drug use: Not Currently    Types: Marijuana     Allergies   Patient has no known allergies.   Review of Systems Review of Systems : negative unless otherwise stated in HPI.      Physical Exam Triage Vital Signs ED Triage Vitals  Encounter Vitals Group     BP 12/13/23 1102 126/78     Systolic BP Percentile --      Diastolic BP Percentile --      Pulse Rate 12/13/23 1102 77     Resp 12/13/23 1102 16     Temp 12/13/23 1102 98.4 F (36.9 C)     Temp Source 12/13/23 1102 Oral     SpO2 12/13/23 1102 98 %     Weight 12/13/23 1101 235 lb (106.6 kg)     Height 12/13/23 1101 5\' 6"  (1.676 m)     Head Circumference --      Peak Flow --      Pain Score 12/13/23 1104 0     Pain Loc --      Pain Education --      Exclude from Growth Chart --    No data found.  Updated Vital Signs BP 126/78 (BP Location: Right Arm)   Pulse 77   Temp  98.4 F (36.9 C) (Oral)   Resp 16   Ht 5\' 6"  (1.676 m)   Wt 106.6 kg   LMP 11/20/2023 (Approximate)   SpO2 98%   BMI 37.93 kg/m   Visual Acuity Right Eye Distance: 20/20 Left Eye Distance: 20/25 Bilateral Distance: 20/20 (w/o correction)  Right Eye Near:   Left Eye Near:    Bilateral Near:     Physical Exam  GEN: pleasant well appearing female, in no acute distress *** NECK: normal ROM  CV: regular rate ***and rhythm, no murmurs appreciated *** RESP: no increased work of breathing, ***clear to ascultation bilaterally EYES:     General: Lids are normal. Lids are everted, no foreign bodies appreciated. Vision grossly intact. Gaze aligned appropriately.        Right eye: No discharge.        Left eye: No foreign body, discharge or hordeolum.     Extraocular Movements: Extraocular movements intact.     Conjunctiva/sclera:     Left eye: ***Left conjunctiva is injected. No chemosis or hemorrhage.    Comments: ***fluorescein stain performed, Corneal abrasions in the 8 through 10 o'clock  position and 3 through 5 o'clock positions, saline rinsed and inspected for foreign bodies  SKIN: warm and dry   UC Treatments / Results  Labs (all labs ordered are listed, but only abnormal results are displayed) Labs Reviewed - No data to display  EKG   Radiology No results found.  Procedures Procedures (including critical care time)  Medications Ordered in UC Medications - No data to display  Initial Impression / Assessment and Plan / UC Course  I have reviewed the triage vital signs and the nursing notes.  Pertinent labs & imaging results that were available during my care of the patient were reviewed by me and considered in my medical decision making (see chart for details).     Patient is a 33 y.o. female who presents after ***left / right eye pain / ***discharge for the past  *** days.  On exam, she has a ***evidence of conjunctivitis on the ***right. Treat with Polytrim eye drops.  Advised to follow-up with an ophthalmologist or optometrist, if  discomfort/pain is not improving after 5-7day course.  ***she has corneal abrasion seen on fluorescein stain.  Sent ***erythromycin ointment to the pharmacy.  Advised to follow-up with an ophthalmologist or optometrist, if  discomfort/pain is not improving after 5-7day course.  Recommended ***pt pick up eye patch from the pharmacy, if desired. Understanding voiced.   Discussed MDM, treatment plan and plan for follow-up with patient/parent who agrees with plan.  Final Clinical Impressions(s) / UC Diagnoses   Final diagnoses:  None   Discharge Instructions   None    ED Prescriptions   None    PDMP not reviewed this encounter.

## 2023-12-13 NOTE — Discharge Instructions (Signed)
 Stop by the pharmacy to pick up your antibiotic eye medication.  Follow up with your primary eyecare provider or [facility] if symptoms suddenly worsen or you have little improvement in your eye symptoms.

## 2023-12-13 NOTE — ED Triage Notes (Signed)
 Pt c/o L eye swelling,burning & pressure since this AM.

## 2023-12-15 ENCOUNTER — Telehealth: Payer: Self-pay | Admitting: Family Medicine

## 2023-12-15 MED ORDER — ERYTHROMYCIN 5 MG/GM OP OINT: TOPICAL_OINTMENT | OPHTHALMIC | 0 refills | Status: DC

## 2023-12-15 NOTE — Telephone Encounter (Signed)
 Did not send in prescription yesterday as discussed with patient. Rx sent to preferred pharmacy.   Katha Cabal, DO

## 2023-12-17 ENCOUNTER — Encounter: Payer: Self-pay | Admitting: Nurse Practitioner

## 2023-12-17 NOTE — Progress Notes (Signed)
 [facility] Department STI clinic [address] [place] [address] Main phone: [phone]  STI screening visit  Subjective:  Emma Gonzalez is a 33 y.o. female being seen today for an STI screening visit. The patient reports they do not have symptoms.  Patient reports that they do not desire a pregnancy in the next year.   They reported they are not interested in discussing contraception today.    Patient's last menstrual period was 11/20/2023 (approximate).  Patient has the following medical conditions:  There are no active problems to display for this patient.   Chief Complaint  Patient presents with   SEXUALLY TRANSMITTED DISEASE   Patient is a pleasant 33 y.o. female who presents to the office today requesting asymptomatic STI testing and reports she is a contact to [place]. Patient indicates 2 female partners in the last 2 months. She reports practicing vaginal and oral sex and does not use condoms or other forms of contraception. Patient indicates STI history including trichomoniasis and gonorrhea. Patient reports last sex was 12/03/23. Patient indicates LMP was 11/20/23 approximately and she has periods monthly.     Last HIV test per patient/review of record was  Lab Results  Component Value Date   HIV Non Reactive 11/11/2015    Last HEPC test per patient/review of record was No results found for: "HMHEPCSCREEN" No components found for: "HEPC"   Last HEPB test per patient/review of record was No components found for: "HMHEPBSCREEN"   Patient reports last pap was:   No results found for: "DIAGPAP", "HPVHIGH", "ADEQPAP" No results found for: "SPECADGYN" No Cervical Cancer Screening results to display.  Screening for MPX risk: Does the patient have an unexplained rash? No Is the patient MSM? No Does the patient endorse multiple sex partners or anonymous sex partners? Yes Did the patient have close or sexual contact with a person  diagnosed with MPX? No Has the patient traveled outside the [place] where MPX is endemic? No Is there a high clinical suspicion for MPX-- evidenced by one of the following No  -Unlikely to be chickenpox  -Lymphadenopathy  -Rash that present in same phase of evolution on any given body part See flowsheet for further details and programmatic requirements.   Immunization history:   There is no immunization history on file for this patient.   The following portions of the patient's history were reviewed and updated as appropriate: allergies, current medications, past medical history, past social history, past surgical history and problem list.  Objective:  There were no vitals filed for this visit.  Physical Exam Nursing note reviewed.  Constitutional:      Appearance: Normal appearance.  HENT:     Head: Normocephalic.     Salivary Glands: Right salivary gland is not diffusely enlarged or tender. Left salivary gland is not diffusely enlarged or tender.     Mouth/Throat:     Lips: Pink. No lesions.     Mouth: Mucous membranes are moist.     Tongue: No lesions. Tongue does not deviate from midline.     Pharynx: Oropharynx is clear. Uvula midline. No oropharyngeal exudate or posterior oropharyngeal erythema.     Tonsils: No tonsillar exudate.  Eyes:     General:        Right eye: No discharge.        Left eye: No discharge.  Pulmonary:     Effort: Pulmonary effort is normal.  Genitourinary:    Comments: Patient asymptomatic. Declines  genital exam. Self-swabbing.  Lymphadenopathy:     Head:     Right side of head: No submental, submandibular, tonsillar, preauricular or posterior auricular adenopathy.     Left side of head: No submental, submandibular, tonsillar, preauricular or posterior auricular adenopathy.     Cervical: No cervical adenopathy.     Right cervical: No superficial or posterior cervical adenopathy.    Left cervical: No superficial or posterior cervical adenopathy.      Upper Body:     Right upper body: No supraclavicular or axillary adenopathy.     Left upper body: No supraclavicular or axillary adenopathy.  Skin:    General: Skin is warm and dry.     Comments: Skin tone appropriate for ethnicity. Assessed exposed areas only and back.   Neurological:     Mental Status: She is alert and oriented to person, place, and time.  Psychiatric:        Attention and Perception: Attention and perception normal.        Mood and Affect: Mood and affect normal.        Speech: Speech normal.        Behavior: Behavior normal. Behavior is cooperative.        Thought Content: Thought content normal.     Assessment and Plan:  Emma Gonzalez is a 33 y.o. female presenting to the [facility] Department for STI screening  1. Screening for venereal disease (Primary)  - Chlamydia/Gonorrhea [place] [facility] - Gonococcus culture - WET PREP FOR TRICH, YEAST, CLUE  2. Trichomoniasis Treating patient as a contact to trich. Wet prep negative in office today.  - metroNIDAZOLE (FLAGYL) 500 MG tablet; Take 1 tablet (500 mg total) by mouth 2 (two) times daily for 7 doses.  Patient accepted screenings including vaginal CT/GC and wet prep. Patient meets criteria for HepB screening? Yes. Ordered? No-Patient declined. Patient meets criteria for HepC screening? Yes. Ordered? No-Patient declined.   Treat wet prep per standing order Discussed time line for [facility] Lab results and that patient will be called with positive results and encouraged patient to call if she had not heard in 2 weeks.  Counseled to return or seek care for continued or worsening symptoms Recommended repeat testing in 3 months with positive results. Recommended condom use with all sex for STI prevention.   Patient is currently using  no method  to prevent pregnancy.    Return if symptoms worsen or fail to improve.  No future appointments.  Total time with patient 15 minutes.   Edmonia James, NP

## 2023-12-19 LAB — GONOCOCCUS CULTURE

## 2024-01-15 ENCOUNTER — Ambulatory Visit
Admission: EM | Admit: 2024-01-15 | Discharge: 2024-01-15 | Disposition: A | Discharge status: Home / Self Care | Attending: Emergency Medicine | Admitting: Emergency Medicine

## 2024-01-15 DIAGNOSIS — H66001 Acute suppurative otitis media without spontaneous rupture of ear drum, right ear: Secondary | ICD-10-CM

## 2024-01-15 DIAGNOSIS — H73011 Bullous myringitis, right ear: Secondary | ICD-10-CM

## 2024-01-15 MED ORDER — IBUPROFEN 600 MG PO TABS: 600.0000 mg | ORAL_TABLET | Freq: Three times a day (TID) | ORAL | 0 refills | Status: AC | PRN

## 2024-01-15 MED ORDER — AMOXICILLIN-POT CLAVULANATE 875-125 MG PO TABS: 1.0000 | ORAL_TABLET | Freq: Two times a day (BID) | ORAL | 0 refills | Status: AC

## 2024-01-15 MED ORDER — AZITHROMYCIN 250 MG PO TABS: 250.0000 mg | ORAL_TABLET | Freq: Every day | ORAL | 0 refills | Status: AC

## 2024-01-15 NOTE — Discharge Instructions (Signed)
 Finish both of the antibiotics, even if you feel better.  Take 600 mg ibuprofen , 1000 mg of Tylenol  3-4 times a day together as needed for pain.  Here is a list of primary care providers who are taking new patients:  [facility] [place] Dr. Rebekah Canada (sports medicine) Dr. Saralee Cummins Cody Das, PA Dr. Henriette Lofty [address] [place] [address] [phone]  [facility] [address] [place], [place] [address] [phone]  [facility] [place] [address] [place] [address]  [phone]  [facility] [address] [place], [place] [address] [phone]  [facility] [address] [place]  [phone] [place], [place] [address]  Here are clinics/ other resources who will see you if you do not have insurance. Some have certain criteria that you must meet. Call them and find out what they are:  [facility]: [address] [place], [place] [address] Phone: [phone] Hours: First and Third Saturdays of each Month, 9 a.m. - 1 p.m.  [facility]: [address], [address] [place], [place] [address] Phone: [phone] Hours: Tuesday, 4 p.m. - 8 p.m. Thursday, 1 p.m. - 8 p.m. Wednesday, 9 a.m. - [facility] [address] [place], [place] [address] Phone: [phone] Pharmacy Phone Number: [phone] Dental Phone Number: [phone] [facility] Insurance Help: [phone]  Dental Hours: Monday - Thursday, 8 a.m. - 6 p.m.  [facility] [address], [place], [place] [address] Phone: [phone] Pharmacy Phone Number: [phone] [facility] Insurance Help: [phone]  [facility] [address] [place], [place] [address] Phone: [phone] Pharmacy Phone Number: [phone] [facility] Insurance Help: [phone]  [facility] [address] [place], [place] [address] Phone: [phone] [facility] Insurance  Help: [phone]   [facility] [address] [place], [place] [address] Phone: [phone]  Go to www.goodrx.com  or www.costplusdrugs.com to look up your medications. This will give you a list of where you can find your prescriptions at the most affordable prices. Or ask the pharmacist what the cash price is, or if they have any other discount programs available to help make your medication more affordable. This can be less expensive than what you would pay with insurance.

## 2024-01-15 NOTE — ED Provider Notes (Signed)
 HPI  SUBJECTIVE:  Emma Gonzalez is a 33 y.o. female who presents with 3 days of constant stabbing, throbbing right ear pain, sensation of ear clogged, absent hearing, headache.  She states that she has felt hot, but has not measured her temperature.  She reports nasal congestion, rhinorrhea.  No itchy, watery eyes, sneezing, otorrhea, vertigo, tinnitus.  She wears earplugs at work.  No recent swimming.  Symptoms started the day after after hearing test.  No antibiotics in the past month.  No antipyretic in the past 6 hours.  She tried Advil  600 mg x 2, over-the-counter pain reliever eardrops.  Symptoms are worse with lying on it.  She has no past medical history.  LMP: 5/2.  Denies the possibility of being pregnant.  PCP: None.    History reviewed. No pertinent past medical history.  Past Surgical History:  Procedure Laterality Date   BREAST SURGERY     TONSILLECTOMY      History reviewed. No pertinent family history.  Social History   Tobacco Use   Smoking status: Every Day    Current packs/day: 0.50    Types: Cigarettes   Smokeless tobacco: Never  Vaping Use   Vaping status: Never Used  Substance Use Topics   Alcohol use: Yes    Comment: 2x/mo   Drug use: Not Currently    Types: Marijuana    No current facility-administered medications for this encounter.  Current Outpatient Medications:    albuterol  (VENTOLIN  HFA) 108 (90 Base) MCG/ACT inhaler, Inhale 2 puffs into the lungs every 4 (four) hours as needed., Disp: 6.7 g, Rfl: 0   amoxicillin -clavulanate (AUGMENTIN ) 875-125 MG tablet, Take 1 tablet by mouth every 12 (twelve) hours for 7 days., Disp: 14 tablet, Rfl: 0   azithromycin  (ZITHROMAX ) 250 MG tablet, Take 1 tablet (250 mg total) by mouth daily. 2 tabs po on day 1, 1 tab po on days 2-5, Disp: 6 tablet, Rfl: 0   ibuprofen  (ADVIL ) 600 MG tablet, Take 1 tablet (600 mg total) by mouth every 8 (eight) hours as needed., Disp: 30 tablet, Rfl: 0  No Known  Allergies   ROS  As noted in HPI.   Physical Exam  BP 118/83 (BP Location: Right Wrist)   Pulse 96   Temp 98.7 F (37.1 C) (Oral)   Resp 16   Ht 5\' 6"  (1.676 m)   Wt 106.6 kg   LMP 01/12/2024   SpO2 100%   BMI 37.93 kg/m   Constitutional: Well developed, well nourished, no acute distress Eyes:  EOMI, conjunctiva normal bilaterally HENT: Normocephalic, atraumatic,mucus membranes moist. Right ear: Hearing decreased compared to the left.  External ear, EAC normal.  No pain with traction on pinna, palpation of tragus or mastoid.  TM dull, erythematous, bulging with bullae on it. Neck: No cervical lymphadenopathy Respiratory: Normal inspiratory effort Cardiovascular: Normal rate GI: nondistended skin: No rash, skin intact Musculoskeletal: no deformities Neurologic: Alert & oriented x 3, no focal neuro deficits Psychiatric: Speech and behavior appropriate   ED Course   Medications - No data to display  No orders of the defined types were placed in this encounter.   No results found for this or any previous visit (from the past 24 hours). No results found.  ED Clinical Impression  1. Bullous myringitis of right ear   2. Non-recurrent acute suppurative otitis media of right ear without spontaneous rupture of tympanic membrane      ED Assessment/Plan      Presentation  consistent with bullous myringitis/acute otitis media.  Home with Augmentin  875 mg p.o. twice daily for 7 days and azithromycin  Z-Pak for 5 days for the bullous myringitis.  Tylenol /ibuprofen .  Return here if no better in 48 to 72 hours since she has no PCP for reevaluation and to change antibiotic.  Will provide primary care list for routine care.  ER return precautions.  Work note.  Discussed  MDM, treatment plan, and plan for follow-up with patient. Discussed sn/sx that should prompt return to the ED. patient agrees with plan.   Meds ordered this encounter  Medications   amoxicillin -clavulanate  (AUGMENTIN ) 875-125 MG tablet    Sig: Take 1 tablet by mouth every 12 (twelve) hours for 7 days.    Dispense:  14 tablet    Refill:  0   azithromycin  (ZITHROMAX ) 250 MG tablet    Sig: Take 1 tablet (250 mg total) by mouth daily. 2 tabs po on day 1, 1 tab po on days 2-5    Dispense:  6 tablet    Refill:  0   ibuprofen  (ADVIL ) 600 MG tablet    Sig: Take 1 tablet (600 mg total) by mouth every 8 (eight) hours as needed.    Dispense:  30 tablet    Refill:  0      *This clinic note was created using Scientist, clinical (histocompatibility and immunogenetics). Therefore, there may be occasional mistakes despite careful proofreading.  ?    Ethlyn Herd, MD 01/15/24 1630

## 2024-01-15 NOTE — ED Triage Notes (Signed)
 Pt c/o R ear pain x3 days
